# Patient Record
Sex: Female | Born: 2000 | Race: Black or African American | Hispanic: No | Marital: Single
Health system: Southern US, Academic
[De-identification: ages and names within clinical notes are randomized; demographics above are authoritative.]

## PROBLEM LIST (undated history)

## (undated) ENCOUNTER — Encounter

## (undated) ENCOUNTER — Encounter
Attending: Student in an Organized Health Care Education/Training Program | Primary: Student in an Organized Health Care Education/Training Program

## (undated) ENCOUNTER — Encounter: Attending: Pediatrics | Primary: Pediatrics

## (undated) ENCOUNTER — Ambulatory Visit: Payer: PRIVATE HEALTH INSURANCE

## (undated) ENCOUNTER — Telehealth

## (undated) ENCOUNTER — Ambulatory Visit

## (undated) ENCOUNTER — Ambulatory Visit
Payer: Medicaid (Managed Care) | Attending: Student in an Organized Health Care Education/Training Program | Primary: Student in an Organized Health Care Education/Training Program

## (undated) ENCOUNTER — Ambulatory Visit
Payer: PRIVATE HEALTH INSURANCE | Attending: Student in an Organized Health Care Education/Training Program | Primary: Student in an Organized Health Care Education/Training Program

## (undated) ENCOUNTER — Non-Acute Institutional Stay: Payer: PRIVATE HEALTH INSURANCE

## (undated) ENCOUNTER — Telehealth: Attending: Pediatrics | Primary: Pediatrics

## (undated) ENCOUNTER — Ambulatory Visit: Payer: Medicaid (Managed Care)

## (undated) ENCOUNTER — Encounter: Attending: Physician Assistant | Primary: Physician Assistant

## (undated) ENCOUNTER — Ambulatory Visit: Attending: Specialist | Primary: Specialist

## (undated) ENCOUNTER — Encounter: Payer: PRIVATE HEALTH INSURANCE | Attending: Pediatrics | Primary: Pediatrics

## (undated) DIAGNOSIS — D899 Disorder involving the immune mechanism, unspecified: Secondary | ICD-10-CM

## (undated) DIAGNOSIS — M0890 Juvenile arthritis, unspecified, unspecified site: Secondary | ICD-10-CM

## (undated) DIAGNOSIS — D849 Immunodeficiency, unspecified: Secondary | ICD-10-CM

## (undated) DIAGNOSIS — M0880 Other juvenile arthritis, unspecified site: Secondary | ICD-10-CM

## (undated) HISTORY — PX: JOINT REPLACEMENT: SHX530

---
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
## 2021-01-01 ENCOUNTER — Other Ambulatory Visit: Payer: Self-pay | Admitting: Pharmacist

## 2021-01-01 ENCOUNTER — Telehealth: Payer: Self-pay | Admitting: Pharmacist

## 2021-01-01 DIAGNOSIS — Z79899 Other long term (current) drug therapy: Secondary | ICD-10-CM

## 2021-01-01 DIAGNOSIS — M0880 Other juvenile arthritis, unspecified site: Secondary | ICD-10-CM

## 2021-01-01 NOTE — Telephone Encounter (Signed)
-----   Message from Fuller Plan, MD sent at 01/01/2021  7:58 AM EDT ----- Regarding: Remicade dosing Her disease appears to be very well controlled, less flares or swelling since switching to rinvoq. I would like to start decreasing her remicade dose frequency first from q4eeks to [place]. I plan to follow up at 3 month intervals if remaining well controlled will reduce this to q8weeks next. Currently has next one scheduled 01/16/21 if that can be moved back 2 weeks if available.

## 2021-01-01 NOTE — Progress Notes (Signed)
Sedimentation rate is improved more down to 53. TB test remains negative. Her vitamin D level is quite low at 16 it should be over 30. This can worsen her skin inflammation also bad for bone health. I recommend she needs to add daily supplementation 2000 units of vitamin D3.

## 2021-01-01 NOTE — Progress Notes (Signed)
Next infusion scheduled for Remicade on 01/16/21 and due for updated orders. Dr. Dimple Casey would like to change patient's Remicade dosing interval from every 4 weeks to every 6 weeks, therefore her next Remicade infusion would have to be r/s to 01/30/21  Diagnosis:polyarticular juvenile idiopathic arthritis  Dose: 1000mg  every 6 weeks  Last Clinic Visit: 12/27/20 Next Clinic Visit: not yet scheduled  Last infusion: 12/19/20  Labs: CBC and CMP on 11/21/20 - wnl TB Gold: negative on 12/27/20  Orders placed for Remicade x 2 doses along with premedication of acetaminophen and diphenhydramine to be administered 30 minutes before medication infusion.  Standing CBC with diff/platelet and CMP with GFR orders placed to be drawn every 2 months.  Next TB gold due 12/27/21  Called patient to review and provide phone number for: [facility] [phone]) [facility] but unable to reach and VM box not set up. Sent MyChart message to patient  Will follow-up to ensured Remicade infusion is re-scheduled and completed  [facility], PharmD, MPH, BCPS Clinical Pharmacist (Rheumatology and Pulmonology)

## 2021-01-03 NOTE — Progress Notes (Signed)
Patient r/s next Remicade infusion to 01/30/21 (which is 6 weeks after last infusion on 12/19/20).  Routing to Dr. Dimple Casey as Lorain Childes.

## 2021-01-16 ENCOUNTER — Ambulatory Visit (HOSPITAL_COMMUNITY): Payer: Medicaid Other

## 2021-01-26 ENCOUNTER — Other Ambulatory Visit: Payer: Self-pay | Admitting: Internal Medicine

## 2021-01-26 DIAGNOSIS — M0880 Other juvenile arthritis, unspecified site: Secondary | ICD-10-CM

## 2021-01-26 NOTE — Telephone Encounter (Signed)
Next Visit: 03/28/2021  Last Visit: 12/27/2020  Last Fill: 10/27/2020  YN:[id number] juvenile idiopathic arthritis   Current Dose per office note 12/27/2020: upadacitinib 15 mg pO daily  Labs: 11/21/2020 WNL  TB Gold: 12/27/2020 Neg    Okay to refill Rinvoq?

## 2021-01-30 ENCOUNTER — Encounter (HOSPITAL_COMMUNITY)
Admission: RE | Admit: 2021-01-30 | Discharge: 2021-01-30 | Disposition: A | Discharge status: Home / Self Care | Payer: Medicaid Other | Source: Ambulatory Visit | Attending: Rheumatology | Admitting: Rheumatology

## 2021-01-30 DIAGNOSIS — M0880 Other juvenile arthritis, unspecified site: Secondary | ICD-10-CM | POA: Diagnosis present

## 2021-01-30 DIAGNOSIS — Z79899 Other long term (current) drug therapy: Secondary | ICD-10-CM | POA: Diagnosis present

## 2021-01-30 LAB — COMPREHENSIVE METABOLIC PANEL
ALT: 19 U/L (ref 0–44)
AST: 21 U/L (ref 15–41)
Albumin: 3.7 g/dL (ref 3.5–5.0)
Alkaline Phosphatase: 47 U/L (ref 38–126)
Anion gap: 7 (ref 5–15)
BUN: 7 mg/dL (ref 6–20)
CO2: 21 mmol/L — ABNORMAL LOW (ref 22–32)
Calcium: 9.1 mg/dL (ref 8.9–10.3)
Chloride: 107 mmol/L (ref 98–111)
Creatinine, Ser: 0.72 mg/dL (ref 0.44–1.00)
GFR, Estimated: >60 mL/min (ref >60)
Glucose, Bld: 114 mg/dL — ABNORMAL HIGH (ref 70–99)
Potassium: 4 mmol/L (ref 3.5–5.1)
Sodium: 135 mmol/L (ref 135–145)
Total Bilirubin: 0.4 mg/dL (ref 0.3–1.2)
Total Protein: 7.2 g/dL (ref 6.5–8.1)

## 2021-01-30 LAB — CBC WITH DIFFERENTIAL/PLATELET
Abs Immature Granulocytes: 0.06 10*3/uL (ref 0.00–0.07)
Basophils Absolute: 0.1 10*3/uL (ref 0.0–0.1)
Basophils Relative: 1 %
Eosinophils Absolute: 0.1 10*3/uL (ref 0.0–0.5)
Eosinophils Relative: 1 %
HCT: 39.7 % (ref 36.0–46.0)
Hemoglobin: 13.3 g/dL (ref 12.0–15.0)
Immature Granulocytes: 1 %
Lymphocytes Relative: 51 %
Lymphs Abs: 4.4 10*3/uL — ABNORMAL HIGH (ref 0.7–4.0)
MCH: 30.3 pg (ref 26.0–34.0)
MCHC: 33.5 g/dL (ref 30.0–36.0)
MCV: 90.4 fL (ref 80.0–100.0)
Monocytes Absolute: 0.7 10*3/uL (ref 0.1–1.0)
Monocytes Relative: 8 %
Neutro Abs: 3.2 10*3/uL (ref 1.7–7.7)
Neutrophils Relative %: 38 %
Platelets: 341 10*3/uL (ref 150–400)
RBC: 4.39 MIL/uL (ref 3.87–5.11)
RDW: 13.4 % (ref 11.5–15.5)
WBC: 8.4 10*3/uL (ref 4.0–10.5)
nRBC: 0 % (ref 0.0–0.2)

## 2021-01-30 MED ORDER — DIPHENHYDRAMINE HCL 25 MG PO CAPS
25.0000 mg | ORAL_CAPSULE | ORAL | Status: DC
  Administered 2021-01-30: 25 mg via ORAL

## 2021-01-30 MED ORDER — SODIUM CHLORIDE 0.9 % IV SOLN
1000.0000 mg | INTRAVENOUS | Status: DC
  Administered 2021-01-30: 1000 mg via INTRAVENOUS
  Filled 2021-01-30 (×2): qty 100

## 2021-01-30 MED ORDER — METHYLPREDNISOLONE SODIUM SUCC 40 MG IJ SOLR: 40.0000 mg | INTRAMUSCULAR | Status: DC

## 2021-01-30 MED ORDER — ACETAMINOPHEN 325 MG PO TABS: 650.0000 mg | ORAL_TABLET | ORAL | Status: DC

## 2021-01-30 MED ORDER — DIPHENHYDRAMINE HCL 25 MG PO CAPS
ORAL_CAPSULE | ORAL | Status: AC
  Filled 2021-01-30: qty 2

## 2021-01-30 MED ORDER — METHYLPREDNISOLONE SODIUM SUCC 40 MG IJ SOLR
INTRAMUSCULAR | Status: AC
  Administered 2021-01-30: 40 mg via INTRAVENOUS
  Filled 2021-01-30: qty 1

## 2021-01-30 MED ORDER — ACETAMINOPHEN 325 MG PO TABS
ORAL_TABLET | ORAL | Status: AC
  Administered 2021-01-30: 650 mg via ORAL
  Filled 2021-01-30: qty 2

## 2021-01-31 NOTE — Progress Notes (Signed)
Lab tests show normal blood count and liver function tests, no medication change needed.

## 2021-03-13 ENCOUNTER — Ambulatory Visit (HOSPITAL_COMMUNITY)
Admission: RE | Admit: 2021-03-13 | Discharge: 2021-03-13 | Disposition: A | Discharge status: Home / Self Care | Payer: Medicaid Other | Source: Ambulatory Visit | Attending: Internal Medicine | Admitting: Internal Medicine

## 2021-03-13 ENCOUNTER — Other Ambulatory Visit: Payer: Self-pay

## 2021-03-13 DIAGNOSIS — M0880 Other juvenile arthritis, unspecified site: Secondary | ICD-10-CM | POA: Diagnosis not present

## 2021-03-13 MED ORDER — METHYLPREDNISOLONE SODIUM SUCC 40 MG IJ SOLR
INTRAMUSCULAR | Status: AC
  Filled 2021-03-13: qty 1

## 2021-03-13 MED ORDER — DIPHENHYDRAMINE HCL 25 MG PO CAPS
25.0000 mg | ORAL_CAPSULE | ORAL | Status: DC
  Administered 2021-03-13: 25 mg via ORAL

## 2021-03-13 MED ORDER — SODIUM CHLORIDE 0.9 % IV SOLN
1000.0000 mg | INTRAVENOUS | Status: DC
  Administered 2021-03-13: 1000 mg via INTRAVENOUS
  Filled 2021-03-13: qty 100

## 2021-03-13 MED ORDER — ACETAMINOPHEN 325 MG PO TABS
ORAL_TABLET | ORAL | Status: AC
  Filled 2021-03-13: qty 2

## 2021-03-13 MED ORDER — METHYLPREDNISOLONE SODIUM SUCC 40 MG IJ SOLR
40.0000 mg | INTRAMUSCULAR | Status: DC
  Administered 2021-03-13: 40 mg via INTRAVENOUS

## 2021-03-13 MED ORDER — DIPHENHYDRAMINE HCL 25 MG PO CAPS
ORAL_CAPSULE | ORAL | Status: AC
  Filled 2021-03-13: qty 1

## 2021-03-13 MED ORDER — ACETAMINOPHEN 325 MG PO TABS
650.0000 mg | ORAL_TABLET | ORAL | Status: DC
  Administered 2021-03-13: 650 mg via ORAL

## 2021-03-19 NOTE — Progress Notes (Signed)
Office Visit Note  Patient: Angie Stone             Date of Birth: October 24, 2000           MRN: [id number]             PCP: Lucila Maine Referring: Teena Irani, PA-C Visit Date: 03/20/2021   Subjective:  Follow-up (Doing good)   History of Present Illness: Angie Stone is a 20 y.o. 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  Symptoms remain well controlled no joint pain swelling or skin disease activity.  Previous HPI 12/27/20 Angie Stone is a 20 y.o. 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 Since our last visit she had no disease activity flare ups. She is tolerating infusions uneventfully. She had increase in facial acne symptoms. Hidradenitis symptoms are not increased. She is not taking any vitamin D supplement.    Previous HPI 03/06/20 Angie Stone is a 20 y.o. 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 She is looking to transfer care to [facility] from [facility] and [place] due to proximity and transitioning to adult rheumatology clinic. Currently she feels symptoms are very well controlled but she restarting the infliximab infusions round March due to symptoms increased on tofacitinib monotherapy.    She denies eye inflammation or visual acuity change. She does have recurrent skin lesions she describes as boils over the same area on her upper torso or axillary region. She denies cough, dyspnea, lymphadenopathy, oral ulcers, diarrhea, hematochezia, or alopecia.   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 She has no history of uveitis. She underwent bilateral hip arthroplasty in 2015.   Previous  treatments: Tocilizumab, methotrexate ~ 2014 Orencia, Kineret, methotrexate 2014-2015, 2016 Remicade, methotrexate [place] 2016-2020 Sulfasalazine 2018-2019 not tolerated due to rash Leflunomide 2019 ineffective Tofacitinib 2019-current Infliximab resumed 05/2019 due to disease flare   Review of Systems  Constitutional:  Negative for fatigue.  HENT:  Negative for mouth dryness.   Eyes:  Negative for dryness.  Respiratory:  Negative for shortness of breath.   Cardiovascular:  Negative for swelling in legs/feet.  Gastrointestinal:  Negative for diarrhea.  Endocrine: Negative for excessive thirst.  Genitourinary:  Negative for difficulty urinating.  Musculoskeletal:  Negative for morning stiffness.  Skin:  Negative for rash.  Allergic/Immunologic: Negative for susceptible to infections.  Neurological:  Negative for weakness.  Hematological:  Negative for bruising/bleeding tendency.  Psychiatric/Behavioral:  Negative for sleep disturbance.    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    Past Medical History:  Diagnosis Date   Immunosuppression (HCC)    [name] (juvenile idiopathic arthritis) (HCC)     Family History  Problem Relation Age of Onset   Diabetes Mother    Diabetes Maternal Grandmother    Arthritis Paternal Grandmother    Past Surgical History:  Procedure Laterality Date   JOINT REPLACEMENT  Bilateral    Hips   Social History   Social History Narrative   Not on file   Immunization History  Administered Date(s) Administered   PFIZER(Purple Top)SARS-COV-2 Vaccination 02/18/2020, 03/22/2020     Objective: Vital Signs: BP 115/71 (BP  Location: Left Arm, Patient Position: Sitting, Cuff Size: Large)    Pulse 89    Resp 15    Ht 4\' 11"  (1.499 m)    Wt 245 lb (111.1 kg)    BMI 49.48 kg/m    Physical Exam Constitutional:      Appearance: She is obese.  HENT:     Right Ear: External ear normal.     Left Ear: External ear normal.     Mouth/Throat:     Mouth: Mucous membranes are moist.     Pharynx: Oropharynx is clear.  Eyes:     Conjunctiva/sclera: Conjunctivae normal.  Cardiovascular:     Rate and Rhythm: Normal rate and regular rhythm.  Pulmonary:     Effort: Pulmonary effort is normal.     Breath sounds: Normal breath sounds.  Skin:    General: Skin is warm and dry.     Comments: Mild facial acne rash  Neurological:     Mental Status: She is alert.  Psychiatric:        Mood and Affect: Mood normal.     Musculoskeletal Exam:  Shoulders full ROM no tenderness or swelling Elbows full ROM no tenderness or swelling Wrists full ROM no tenderness or swelling Fingers full ROM no tenderness or swelling Knees full ROM no tenderness or swelling Ankles full ROM no tenderness or swelling MTPs full ROM no tenderness or swelling   CDAI Exam: CDAI Score: 1  Patient Global: 10 mm; Provider Global: 0 mm Swollen: 0 ; Tender: 0  Joint Exam 03/20/2021   All documented joints were normal     Investigation: No additional findings.  Imaging: No results found.  Recent Labs: Lab Results  Component Value Date   WBC 8.4 01/30/2021   HGB 13.3 01/30/2021   PLT 341 01/30/2021   NA 135 01/30/2021   K 4.0 01/30/2021   CL 107 01/30/2021   CO2 21 (L) 01/30/2021   GLUCOSE 114 (H) 01/30/2021   BUN 7 01/30/2021   CREATININE 0.72 01/30/2021   BILITOT 0.4 01/30/2021   ALKPHOS 47 01/30/2021   AST 21 01/30/2021   ALT 19 01/30/2021   PROT 7.2 01/30/2021   ALBUMIN 3.7 01/30/2021   CALCIUM 9.1 01/30/2021   GFRAA 144 09/13/2020   QFTBGOLDPLUS NEGATIVE 12/27/2020    Speciality Comments: Diagnosed in 2010. Combination  of tocilizumab IV q2wk and MTX-initiation unclear-d/c 09/2012 Combination Orencia IV q4wk, Kineret sq daily, MTX-7/14-4/15 Tried injectable MTX 30 mg weekly-d/c SE and inefficacy  Remicade infusions 10 mg/kg every 4 weeks Tried SSZ in 2018 and again 2019-d/c both times due to rash Arava-05/16/17-01/06/18-Inadequate response Xeljanz started 01/08/18  Procedures:  No procedures performed Allergies: Sulfasalazine   Assessment / Plan:     Visit Diagnoses: Polyarticular juvenile idiopathic arthritis ([place]) - Plan: Sedimentation rate  Symptoms appear well controlled on history and exam today.  Checking sed rate for disease activity monitoring.  Plan to continue Rinvoq 15 mg p.o. daily.  If abnormal will continue with decreasing infusion frequency next step to every 8 weeks.  High risk medication use - Plan: CBC with Differential/Platelet, COMPLETE METABOLIC PANEL WITH GFR  Checking CBC and CMP today for medication monitoring with Rinvoq and Remicade.  Continuing to slowly  titrate down medication to reduce risk of adverse events of excessive immunosuppression.  Vitamin D deficiency    Orders: Orders Placed This Encounter  Procedures   Sedimentation rate   CBC with Differential/Platelet   COMPLETE METABOLIC PANEL WITH GFR   No orders of the defined types were placed in this encounter.    Follow-Up Instructions: Return in about 3 months (around 06/18/2021) for RA on UPA/INF f/u 64mos.   Collier Salina, MD  Note - This record has been created using [employer].  Chart creation errors have been sought, but may not always  have been located. Such creation errors do not reflect on  the standard of medical care.

## 2021-03-20 ENCOUNTER — Ambulatory Visit (INDEPENDENT_AMBULATORY_CARE_PROVIDER_SITE_OTHER): Payer: Medicaid Other | Admitting: Internal Medicine

## 2021-03-20 ENCOUNTER — Other Ambulatory Visit: Payer: Self-pay

## 2021-03-20 ENCOUNTER — Encounter: Payer: Self-pay | Admitting: Internal Medicine

## 2021-03-20 VITALS — BP 115/71 | HR 89 | Resp 15 | Ht 59.0 in | Wt 245.0 lb

## 2021-03-20 DIAGNOSIS — E559 Vitamin D deficiency, unspecified: Secondary | ICD-10-CM | POA: Diagnosis not present

## 2021-03-20 DIAGNOSIS — Z79899 Other long term (current) drug therapy: Secondary | ICD-10-CM | POA: Diagnosis not present

## 2021-03-20 DIAGNOSIS — M0880 Other juvenile arthritis, unspecified site: Secondary | ICD-10-CM

## 2021-03-20 LAB — CBC WITH DIFFERENTIAL/PLATELET
Absolute Monocytes: 921 cells/uL (ref 200–950)
Basophils Absolute: 59 cells/uL (ref 0–200)
Basophils Relative: 0.6 %
Eosinophils Absolute: 69 cells/uL (ref 15–500)
Eosinophils Relative: 0.7 %
HCT: 42.4 % (ref 35.0–45.0)
Hemoglobin: 13.7 g/dL (ref 11.7–15.5)
Lymphs Abs: 3851 cells/uL (ref 850–3900)
MCH: 29 pg (ref 27.0–33.0)
MCHC: 32.3 g/dL (ref 32.0–36.0)
MCV: 89.8 fL (ref 80.0–100.0)
MPV: 9.2 fL (ref 7.5–12.5)
Monocytes Relative: 9.4 %
Neutro Abs: 4900 cells/uL (ref 1500–7800)
Neutrophils Relative %: 50 %
Platelets: 356 10*3/uL (ref 140–400)
RBC: 4.72 10*6/uL (ref 3.80–5.10)
RDW: 13.1 % (ref 11.0–15.0)
Total Lymphocyte: 39.3 %
WBC: 9.8 10*3/uL (ref 3.8–10.8)

## 2021-03-20 LAB — COMPLETE METABOLIC PANEL WITH GFR
AG Ratio: 1.4 (calc) (ref 1.0–2.5)
ALT: 19 U/L (ref 5–32)
AST: 15 U/L (ref 12–32)
Albumin: 4.3 g/dL (ref 3.6–5.1)
Alkaline phosphatase (APISO): 60 U/L (ref 36–128)
BUN: 10 mg/dL (ref 7–20)
CO2: 23 mmol/L (ref 20–32)
Calcium: 9.4 mg/dL (ref 8.9–10.4)
Chloride: 105 mmol/L (ref 98–110)
Creat: 0.73 mg/dL (ref 0.50–0.96)
Globulin: 3 g/dL (calc) (ref 2.0–3.8)
Glucose, Bld: 78 mg/dL (ref 65–99)
Potassium: 4.1 mmol/L (ref 3.8–5.1)
Sodium: 137 mmol/L (ref 135–146)
Total Bilirubin: 0.5 mg/dL (ref 0.2–1.1)
Total Protein: 7.3 g/dL (ref 6.3–8.2)
eGFR: 121 mL/min/{1.73_m2} (ref >=60)

## 2021-03-20 LAB — SEDIMENTATION RATE: Sed Rate: 6 mm/h (ref 0–20)

## 2021-03-21 NOTE — Progress Notes (Signed)
Angie Stone is doing well with increased interval between remicade doses. I think after her next one scheduled in January we can decrease frequency to every 8 weeks afterwards.

## 2021-03-21 NOTE — Progress Notes (Signed)
Lab results look good her blood count and metabolic panel are normal so no problems with the medication. Her sedimentation rate is now normal which is an improvement compared to the past year.

## 2021-03-23 ENCOUNTER — Other Ambulatory Visit: Payer: Self-pay | Admitting: Pharmacist

## 2021-03-23 DIAGNOSIS — M0880 Other juvenile arthritis, unspecified site: Secondary | ICD-10-CM

## 2021-03-23 DIAGNOSIS — Z79899 Other long term (current) drug therapy: Secondary | ICD-10-CM

## 2021-03-23 NOTE — Progress Notes (Signed)
Next infusion scheduled for Remicade on 04/24/21 and due for updated orders. Diagnosis: polyarticular juvenile arthritis  Dose: 1000mg  every 8 weeks (infusion on 04/24/21 will be last infusion for every 6 weeks interval. Subsequent infusion will be in 8 weeks on 06/19/21)  Last Clinic Visit: 03/20/21 Next Clinic Visit: not yet scheduled  Last infusion: 03/13/21  Labs: 03/20/21 wnl TB Gold: negative on 12/27/20   Orders placed for Remicade x 2 doses along with premedication of acetaminophen, methyprednisolone 40mg , and diphenhydramine to be administered 30 minutes before medication infusion.  Standing CBC with diff/platelet and CMP with GFR orders placed to be drawn every 2 months.  Next TB gold due 12/27/21  , PharmD, MPH, BCPS Clinical Pharmacist (Rheumatology and Pulmonology)

## 2021-03-28 ENCOUNTER — Ambulatory Visit: Payer: Medicaid Other | Admitting: Internal Medicine

## 2021-04-24 ENCOUNTER — Ambulatory Visit (HOSPITAL_COMMUNITY)
Admission: RE | Admit: 2021-04-24 | Discharge: 2021-04-24 | Disposition: A | Discharge status: Home / Self Care | Payer: Medicaid Other | Source: Ambulatory Visit | Attending: Internal Medicine | Admitting: Internal Medicine

## 2021-04-24 DIAGNOSIS — M0880 Other juvenile arthritis, unspecified site: Secondary | ICD-10-CM | POA: Insufficient documentation

## 2021-04-24 MED ORDER — SODIUM CHLORIDE 0.9 % IV SOLN
1000.0000 mg | INTRAVENOUS | Status: DC
  Administered 2021-04-24: 12:00:00 1000 mg via INTRAVENOUS
  Filled 2021-04-24: qty 100

## 2021-04-24 MED ORDER — ACETAMINOPHEN 325 MG PO TABS
650.0000 mg | ORAL_TABLET | ORAL | Status: DC
  Administered 2021-04-24: 11:00:00 650 mg via ORAL

## 2021-04-24 MED ORDER — METHYLPREDNISOLONE SODIUM SUCC 40 MG IJ SOLR
40.0000 mg | INTRAMUSCULAR | Status: DC
  Administered 2021-04-24: 11:00:00 40 mg via INTRAVENOUS

## 2021-04-24 MED ORDER — ACETAMINOPHEN 325 MG PO TABS
ORAL_TABLET | ORAL | Status: AC
  Filled 2021-04-24: qty 2

## 2021-04-24 MED ORDER — DIPHENHYDRAMINE HCL 25 MG PO CAPS
25.0000 mg | ORAL_CAPSULE | ORAL | Status: DC
  Administered 2021-04-24: 11:00:00 25 mg via ORAL

## 2021-04-24 MED ORDER — METHYLPREDNISOLONE SODIUM SUCC 40 MG IJ SOLR
INTRAMUSCULAR | Status: AC
  Filled 2021-04-24: qty 1

## 2021-04-24 MED ORDER — DIPHENHYDRAMINE HCL 25 MG PO CAPS
ORAL_CAPSULE | ORAL | Status: AC
  Filled 2021-04-24: qty 1

## 2021-04-26 ENCOUNTER — Other Ambulatory Visit: Payer: Self-pay | Admitting: Internal Medicine

## 2021-04-26 DIAGNOSIS — M0880 Other juvenile arthritis, unspecified site: Secondary | ICD-10-CM

## 2021-04-26 NOTE — Telephone Encounter (Signed)
Next Visit: Due March 2023. Message sent to the front to schedule patient   Last Visit: 03/20/2021  Last Fill: 01/26/2021  WU:[id number]:Polyarticular juvenile idiopathic arthritis   Current Dose per office note 03/20/2021: Rinvoq 15 mg p.o. daily.  Labs: 03/20/2021 Lab results look good her blood count and metabolic panel are normal so no problems with the medication  TB Gold: 12/27/2020 Neg    Okay to refill Rinvoq?

## 2021-05-10 ENCOUNTER — Telehealth: Payer: Self-pay

## 2021-05-10 NOTE — Telephone Encounter (Signed)
Pt has coverage through [facility] Medicaid. Contacted plan and verified that no PA is required for [id number] when performed in an outpatient hospital during for 2023.  Ref# [id number]

## 2021-05-23 ENCOUNTER — Telehealth: Payer: Self-pay

## 2021-05-23 NOTE — Telephone Encounter (Signed)
Patient called stating she would like to change her infusions to every 6 weeks instead of every 8 weeks.  Patient requested a return call.

## 2021-05-28 ENCOUNTER — Encounter: Payer: Self-pay | Admitting: Internal Medicine

## 2021-05-28 NOTE — Telephone Encounter (Signed)
Angie Stone reports increased joint pains since about 2 weeks ago in bilateral knees and feet. Last infusion was about 1 month ago in January. I am not sure if this is really related to disease activity difference with dosing change. But she is concerned for this and I think it is reasonable to slow down our tapering and keep this q6 weeks for now. Next infusion was scheduled for 3/21 so would be potentially moving that forward 2 weeks.

## 2021-05-31 ENCOUNTER — Other Ambulatory Visit: Payer: Self-pay | Admitting: Pharmacist

## 2021-05-31 DIAGNOSIS — Z79899 Other long term (current) drug therapy: Secondary | ICD-10-CM

## 2021-05-31 DIAGNOSIS — M0880 Other juvenile arthritis, unspecified site: Secondary | ICD-10-CM

## 2021-05-31 NOTE — Progress Notes (Signed)
Next infusion scheduled for Remicade on 06/19/21 and due for updated orders. ?Diagnosis: polyarticular juvenile arthritis ? ?Dose: 1000mg  every 6 weeks (patient is going back to every 6 weeks infusions due to increased joint pain- last infusion was 04/24/21 and can have next infusion scheduled for 06/05/21 earliest) ? ?Last Clinic Visit: 03/20/21 ?Next Clinic Visit: not yet scheduled ? ?Last infusion: 04/24/21 ? ?Labs: 03/20/21 wnl ?TB Gold: negative on 12/27/20  ? ?Orders placed for Remicade x 2 doses along with premedication of acetaminophen, methyprednisolone 40mg , and diphenhydramine to be administered 30 minutes before medication infusion. ? ?Standing CBC with diff/platelet and CMP with GFR orders placed to be drawn every 2 months.  Next TB gold due 12/27/21 ? ?I called patient to notify and advised her to call [facility] to have infusion r/s to be on or after 06/05/21 ? ?Knox Saliva, PharmD, MPH, BCPS ?Clinical Pharmacist (Rheumatology and Pulmonology) ? ?

## 2021-05-31 NOTE — Telephone Encounter (Signed)
Remicade infusion order renewed for every 6 week interval. I called patient to notify that she should reach out to [facility] to r/s next infusion to be on or after 06/05/21. She verbalized understanding. She states she has phone number to have infusion r/s ? ?Knox Saliva, PharmD, MPH, BCPS ?Clinical Pharmacist (Rheumatology and Pulmonology) ?

## 2021-05-31 NOTE — Telephone Encounter (Signed)
Patient r/s Remicade to 06/11/21. ? ?Chesley Mires, PharmD, MPH, BCPS ?Clinical Pharmacist (Rheumatology and Pulmonology) ?

## 2021-06-05 ENCOUNTER — Ambulatory Visit (HOSPITAL_COMMUNITY): Payer: Medicaid Other

## 2021-06-11 ENCOUNTER — Ambulatory Visit (HOSPITAL_COMMUNITY)
Admission: RE | Admit: 2021-06-11 | Discharge: 2021-06-11 | Disposition: A | Discharge status: Home / Self Care | Payer: Medicaid Other | Source: Ambulatory Visit | Attending: Internal Medicine | Admitting: Internal Medicine

## 2021-06-11 ENCOUNTER — Other Ambulatory Visit: Payer: Self-pay

## 2021-06-11 DIAGNOSIS — M0880 Other juvenile arthritis, unspecified site: Secondary | ICD-10-CM | POA: Diagnosis present

## 2021-06-11 DIAGNOSIS — Z79899 Other long term (current) drug therapy: Secondary | ICD-10-CM | POA: Insufficient documentation

## 2021-06-11 LAB — COMPREHENSIVE METABOLIC PANEL
ALT: 18 U/L (ref 0–44)
AST: 40 U/L (ref 15–41)
Albumin: 3.4 g/dL — ABNORMAL LOW (ref 3.5–5.0)
Alkaline Phosphatase: 41 U/L (ref 38–126)
Anion gap: 8 (ref 5–15)
BUN: 6 mg/dL (ref 6–20)
CO2: 21 mmol/L — ABNORMAL LOW (ref 22–32)
Calcium: 8.6 mg/dL — ABNORMAL LOW (ref 8.9–10.3)
Chloride: 110 mmol/L (ref 98–111)
Creatinine, Ser: 0.84 mg/dL (ref 0.44–1.00)
GFR, Estimated: >60 mL/min (ref >60)
Glucose, Bld: 97 mg/dL (ref 70–99)
Potassium: 5.2 mmol/L — ABNORMAL HIGH (ref 3.5–5.1)
Sodium: 139 mmol/L (ref 135–145)
Total Bilirubin: 1.7 mg/dL — ABNORMAL HIGH (ref 0.3–1.2)
Total Protein: 6.4 g/dL — ABNORMAL LOW (ref 6.5–8.1)

## 2021-06-11 LAB — CBC WITH DIFFERENTIAL/PLATELET
Abs Immature Granulocytes: 0.07 10*3/uL (ref 0.00–0.07)
Basophils Absolute: 0.1 10*3/uL (ref 0.0–0.1)
Basophils Relative: 1 %
Eosinophils Absolute: 0.1 10*3/uL (ref 0.0–0.5)
Eosinophils Relative: 1 %
HCT: 38.6 % (ref 36.0–46.0)
Hemoglobin: 13 g/dL (ref 12.0–15.0)
Immature Granulocytes: 1 %
Lymphocytes Relative: 52 %
Lymphs Abs: 4.4 10*3/uL — ABNORMAL HIGH (ref 0.7–4.0)
MCH: 30.2 pg (ref 26.0–34.0)
MCHC: 33.7 g/dL (ref 30.0–36.0)
MCV: 89.6 fL (ref 80.0–100.0)
Monocytes Absolute: 0.8 10*3/uL (ref 0.1–1.0)
Monocytes Relative: 10 %
Neutro Abs: 2.9 10*3/uL (ref 1.7–7.7)
Neutrophils Relative %: 35 %
Platelets: 331 10*3/uL (ref 150–400)
RBC: 4.31 MIL/uL (ref 3.87–5.11)
RDW: 14.2 % (ref 11.5–15.5)
WBC: 8.3 10*3/uL (ref 4.0–10.5)
nRBC: 0 % (ref 0.0–0.2)

## 2021-06-11 MED ORDER — DIPHENHYDRAMINE HCL 25 MG PO CAPS
25.0000 mg | ORAL_CAPSULE | ORAL | Status: AC
  Administered 2021-06-11: 25 mg via ORAL

## 2021-06-11 MED ORDER — DIPHENHYDRAMINE HCL 25 MG PO CAPS
ORAL_CAPSULE | ORAL | Status: AC
  Filled 2021-06-11: qty 1

## 2021-06-11 MED ORDER — SODIUM CHLORIDE 0.9 % IV SOLN
1000.0000 mg | INTRAVENOUS | Status: AC
  Administered 2021-06-11: 1000 mg via INTRAVENOUS
  Filled 2021-06-11: qty 100

## 2021-06-11 MED ORDER — ACETAMINOPHEN 325 MG PO TABS
650.0000 mg | ORAL_TABLET | ORAL | Status: AC
  Administered 2021-06-11: 650 mg via ORAL

## 2021-06-11 MED ORDER — METHYLPREDNISOLONE SODIUM SUCC 40 MG IJ SOLR
40.0000 mg | INTRAMUSCULAR | Status: DC
  Administered 2021-06-11: 40 mg via INTRAVENOUS

## 2021-06-11 MED ORDER — ACETAMINOPHEN 325 MG PO TABS
ORAL_TABLET | ORAL | Status: AC
Start: 2021-06-11 — End: 2021-06-11
  Filled 2021-06-11: qty 2

## 2021-06-11 MED ORDER — METHYLPREDNISOLONE SODIUM SUCC 40 MG IJ SOLR
INTRAMUSCULAR | Status: AC
  Filled 2021-06-11: qty 1

## 2021-06-12 NOTE — Progress Notes (Signed)
Labs look okay for continuing remicade and rinvoq. She has some mild electrolyte abnormalities but these are not effects of these medications. Could she have been dehydrated or recently ill when labs were taken?

## 2021-06-19 ENCOUNTER — Ambulatory Visit (HOSPITAL_COMMUNITY): Payer: Medicaid Other

## 2021-07-23 ENCOUNTER — Other Ambulatory Visit: Payer: Self-pay | Admitting: *Deleted

## 2021-07-23 ENCOUNTER — Ambulatory Visit (HOSPITAL_COMMUNITY)
Admission: RE | Admit: 2021-07-23 | Discharge: 2021-07-23 | Disposition: A | Discharge status: Home / Self Care | Payer: Medicaid Other | Source: Ambulatory Visit | Attending: Internal Medicine | Admitting: Internal Medicine

## 2021-07-23 ENCOUNTER — Encounter: Payer: Self-pay | Admitting: Internal Medicine

## 2021-07-23 ENCOUNTER — Other Ambulatory Visit: Payer: Self-pay | Admitting: Pharmacist

## 2021-07-23 DIAGNOSIS — M0880 Other juvenile arthritis, unspecified site: Secondary | ICD-10-CM

## 2021-07-23 DIAGNOSIS — Z79899 Other long term (current) drug therapy: Secondary | ICD-10-CM | POA: Diagnosis present

## 2021-07-23 MED ORDER — ACETAMINOPHEN 325 MG PO TABS
650.0000 mg | ORAL_TABLET | ORAL | Status: DC
  Administered 2021-07-23: 650 mg via ORAL

## 2021-07-23 MED ORDER — METHYLPREDNISOLONE SODIUM SUCC 40 MG IJ SOLR
40.0000 mg | INTRAMUSCULAR | Status: DC
  Administered 2021-07-23: 40 mg via INTRAVENOUS

## 2021-07-23 MED ORDER — ACETAMINOPHEN 325 MG PO TABS
ORAL_TABLET | ORAL | Status: AC
  Filled 2021-07-23: qty 2

## 2021-07-23 MED ORDER — DIPHENHYDRAMINE HCL 25 MG PO CAPS
ORAL_CAPSULE | ORAL | Status: AC
  Filled 2021-07-23: qty 1

## 2021-07-23 MED ORDER — DIPHENHYDRAMINE HCL 25 MG PO CAPS
25.0000 mg | ORAL_CAPSULE | ORAL | Status: DC
  Administered 2021-07-23: 25 mg via ORAL

## 2021-07-23 MED ORDER — SODIUM CHLORIDE 0.9 % IV SOLN
1000.0000 mg | INTRAVENOUS | Status: DC
  Administered 2021-07-23: 1000 mg via INTRAVENOUS
  Filled 2021-07-23: qty 100

## 2021-07-23 MED ORDER — METHYLPREDNISOLONE SODIUM SUCC 40 MG IJ SOLR
INTRAMUSCULAR | Status: AC
  Filled 2021-07-23: qty 1

## 2021-07-23 NOTE — Telephone Encounter (Signed)
Referral for dermatology placed, appt scheduled 07/31/2021, I called patient. ?

## 2021-07-23 NOTE — Telephone Encounter (Signed)
I have not seen her face to face since December so would also benefit to schedule an appointment soon and see if we need to adjust her medication or prescribe something. ?I think we should go ahead with referring her to a dermatologist. This is concerning that it may be increased activity of her hidradenitis.

## 2021-07-23 NOTE — Progress Notes (Signed)
Next infusion scheduled for Remicade on 09/03/21 and due for updated orders. She takes in combination with Rinvoq 15mg  daily ? ?Diagnosis: polyarticular juvenile idiopathic arthritis ? ?Dose: 1000 mg IV every 6 weeks ? ?Last Clinic Visit: 07/31/21 ?Next Clinic Visit: 12/11/21 ? ?Last infusion: 07/23/21 ? ?Labs: CBC and CMP on 06/11/21  - mild electrolyte abnormalities, CBC stable ?TB Gold: negative on 12/27/20  ? ?Orders placed for Remicade x 2 doses along with premedication of acetaminophen, methylprednisolone 40mg , and diphenhydramine to be administered 30 minutes before medication infusion. ? ?Standing CBC with diff/platelet and CMP with GFR orders placed to be drawn every 2 months.  Next TB gold due 12/27/21 ? ?Chesley Mires, PharmD, MPH, BCPS ?Clinical Pharmacist (Rheumatology and Pulmonology) ?

## 2021-07-24 ENCOUNTER — Other Ambulatory Visit: Payer: Self-pay | Admitting: Internal Medicine

## 2021-07-24 DIAGNOSIS — M0880 Other juvenile arthritis, unspecified site: Secondary | ICD-10-CM

## 2021-07-30 ENCOUNTER — Ambulatory Visit (INDEPENDENT_AMBULATORY_CARE_PROVIDER_SITE_OTHER): Payer: Medicaid Other | Admitting: Dermatology

## 2021-07-30 ENCOUNTER — Encounter: Payer: Self-pay | Admitting: Dermatology

## 2021-07-30 DIAGNOSIS — L732 Hidradenitis suppurativa: Secondary | ICD-10-CM

## 2021-07-30 MED ORDER — DOXYCYCLINE MONOHYDRATE 50 MG PO CAPS: 50.0000 mg | ORAL_CAPSULE | Freq: Every day | ORAL | 2 refills | Status: DC

## 2021-07-30 NOTE — Patient Instructions (Addendum)
Hidradenitis Suppurativa is a chronic; persistent; non-curable, but treatable condition due to abnormal inflamed sweat glands in the body folds (axilla, inframammary, groin, medial thighs), causing recurrent painful draining cysts and scarring. It can be associated with severe scarring acne and cysts; also abscesses and scarring of scalp. The goal is control and prevention of flares, as it is not curable. Scars are permanent and can be thickened. Treatment may include daily use of topical medication and oral antibiotics.  Oral isotretinoin may also be helpful.  For more severe cases, Humira (a biologic injection) may be prescribed to decrease the inflammatory process and prevent flares.  When indicated, inflamed cysts may also be treated surgically. ? ? ?Doxycycline should be taken with food to prevent nausea. Do not lay down for 30 minutes after taking. Be cautious with sun exposure and use good sun protection while on this medication. Pregnant women should not take this medication.  ? ?Start CLN wash - can be purchased over the counter  can use daily ( use as a body wash, lather on to affected areas, let sit for a few minutes, then rinse.  ? ? ? ? ? ? ?If You Need Anything After Your Visit ? ?If you have any questions or concerns for your doctor, please call our main line at [phone] and press option 4 to reach your doctor's medical assistant. If no one answers, please leave a voicemail as directed and we will return your call as soon as possible. Messages left after 4 pm will be answered the following business day.  ? ?You may also send [place] a message via MyChart. We typically respond to MyChart messages within 1-2 business days. ? ?For prescription refills, please ask your pharmacy to contact our office. Our fax number is [phone]. ? ?If you have an urgent issue when the clinic is closed that cannot wait until the next business day, you can page your doctor at the number below.   ? ?Please note that while  we do our best to be available for urgent issues outside of office hours, we are not available 24/7.  ? ?If you have an urgent issue and are unable to reach [place], you may choose to seek medical care at your doctor's office, retail clinic, urgent care center, or emergency room. ? ?If you have a medical emergency, please immediately call 911 or go to the emergency department. ? ?Pager Numbers ? ?- Dr. Gwen Pounds: [phone] ? ?- Dr. Neale Burly: [phone] ? ?- Dr. Roseanne Reno: [phone] ? ?In the event of inclement weather, please call our main line at [phone] for an update on the status of any delays or closures. ? ?Dermatology Medication Tips: ?Please keep the boxes that topical medications come in in order to help keep track of the instructions about where and how to use these. Pharmacies typically print the medication instructions only on the boxes and not directly on the medication tubes.  ? ?If your medication is too expensive, please contact our office at [phone] option 4 or send [place] a message through MyChart.  ? ?We are unable to tell what your co-pay for medications will be in advance as this is different depending on your insurance coverage. However, we may be able to find a substitute medication at lower cost or fill out paperwork to get insurance to cover a needed medication.  ? ?If a prior authorization is required to get your medication covered by your insurance company, please allow [place] 1-2 business days to complete this process. ? ?  Drug prices often vary depending on where the prescription is filled and some pharmacies may offer cheaper prices. ? ?The website www.goodrx.com contains coupons for medications through different pharmacies. The prices here do not account for what the cost may be with help from insurance (it may be cheaper with your insurance), but the website can give you the price if you did not use any insurance.  ?- You can print the associated coupon and take it with your prescription  to the pharmacy.  ?- You may also stop by our office during regular business hours and pick up a GoodRx coupon card.  ?- If you need your prescription sent electronically to a different pharmacy, notify our office through [facility] or by phone at [phone] option 4. ? ? ? ? ?Si Usted Necesita Algo Despu?s de Su Visita ? ?Tambi?n puede enviarnos un mensaje a trav?s de MyChart. Por lo general respondemos a los mensajes de MyChart en el transcurso de 1 a 2 d?as h?biles. ? ?Para renovar recetas, por favor pida a su farmacia que se ponga en contacto con nuestra oficina. Nuestro n?mero de fax es el [phone]. ? ?Si tiene un asunto urgente cuando la cl?nica est? cerrada y que no puede esperar hasta el siguiente d?a h?bil, puede llamar/localizar a su doctor(a) al n?mero que aparece a continuaci?n.  ? ?Por favor, tenga en cuenta que aunque hacemos todo lo posible para estar disponibles para asuntos urgentes fuera del horario de oficina, no estamos disponibles las 24 horas del d?a, los 7 d?as de la semana.  ? ?Si tiene un problema urgente y no puede comunicarse con nosotros, puede optar por buscar atenci?n m?dica  en el consultorio de su doctor(a), en una cl?nica privada, en un centro de atenci?n urgente o en una sala de emergencias. ? ?Si tiene Radio broadcast assistant m?dica, por favor llame inmediatamente al 911 o vaya a la sala de emergencias. ? ?N?meros de b?per ? ?- Dr. Gwen Pounds: [phone] ? ?- Dra. Moye: [phone] ? ?- Dra. Roseanne Reno: [phone] ? ?En caso de inclemencias del tiempo, por favor llame a nuestra l?nea principal al [phone] para una actualizaci?n sobre el estado de cualquier retraso o cierre. ? ?Consejos para la medicaci?n en dermatolog?a: ?Por favor, guarde las cajas en las que vienen los medicamentos de uso t?pico para ayudarle a seguir las instrucciones sobre d?nde y c?mo usarlos. Las farmacias generalmente imprimen las instrucciones del medicamento s?lo en las cajas y no directamente en  los tubos del [place].  ? ?Si su medicamento es muy caro, por favor, p?ngase en contacto con Rolm Gala llamando al [phone] y presione la opci?n 4 o env?enos un mensaje a trav?s de MyChart.  ? ?No podemos decirle cu?l ser? su copago por los medicamentos por adelantado ya que esto es diferente dependiendo de la cobertura de su seguro. Sin embargo, es posible que podamos encontrar un medicamento sustituto a Audiological scientist un formulario para que el seguro cubra el medicamento que se considera necesario.  ? ?Si se requiere Neomia Dear autorizaci?n previa para que su compa??a de seguros [place] su medicamento, por favor perm?tanos de 1 a 2 d?as h?biles para completar este proceso. ? ?Los precios de los medicamentos var?an con frecuencia dependiendo del Environmental consultant de d?nde se surte la receta y alguna farmacias pueden ofrecer precios m?s baratos. ? ?El sitio web www.goodrx.com tiene cupones para medicamentos de Health and safety inspector. Los precios aqu? no tienen en cuenta lo que podr?a costar con la ayuda del seguro (puede ser m?s  barato con su seguro), pero el sitio web puede darle el precio si no utiliz? ning?n seguro.  ?- Puede imprimir el cup?n correspondiente y llevarlo con su receta a la farmacia.  ?- Tambi?n puede pasar por nuestra oficina durante el horario de atenci?n regular y recoger una tarjeta de cupones de GoodRx.  ?- Si necesita que su receta se env?e electr?nicamente a Psychiatrist, informe a nuestra oficina a trav?s de MyChart de [place] o por tel?fono llamando al [phone] y presione la opci?n 4. ? ?

## 2021-07-30 NOTE — Progress Notes (Signed)
? ?  New Patient Visit ? ?Subjective  ?Angie Stone is a 21 y.o. female who presents for the following: New Patient (Initial Visit) (Patient reports hx of hs of 2 or more years. Patient has active boil under right arm , hx of boils at b/l arms, under breast, and inner thighs. Patient has been rx of clindamycin and other antibiotic to treat. /). ? ?The following portions of the chart were reviewed this encounter and updated as appropriate:  ? Tobacco  Allergies  Meds  Problems  Med Hx  Surg Hx  Fam Hx   ?  ?Review of Systems:  No other skin or systemic complaints except as noted in HPI or Assessment and Plan. ? ?Objective  ?Well appearing patient in no apparent distress; mood and affect are within normal limits. ? ?A focused examination was performed including b/l axilla, inner thigh, b/l inframammary . Relevant physical exam findings are noted in the Assessment and Plan. ? 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 ? ? ? ? ? ? ? ? ? ? ?Assessment & Plan  ?Hidradenitis suppurativa ?b/l axilla, b/l inframammary ? ?Hidradenitis Suppurativa is a chronic; persistent; non-curable, but treatable condition due to abnormal inflamed sweat glands in the body folds (axilla, inframammary, groin, medial thighs), causing recurrent painful draining cysts and scarring. It can be associated with severe scarring acne and cysts; also abscesses and scarring of scalp. The goal is control and prevention of flares, as it is not curable. Scars are permanent and can be thickened. Treatment may include daily use of topical medication and oral antibiotics.  Oral isotretinoin may also be helpful.  For more severe cases, Humira (a biologic injection) may be prescribed to decrease the inflammatory process and prevent flares.  When indicated, inflamed cysts may also be treated surgically. ? ?HS Pamphlet Given ? ?Photos today  ? ?Start Doxycycline 50 mg capsule - take 1 po qd with evening meal and drink.   ?Start CLN wash. ? ?Doxycycline should be taken with food to prevent nausea. Do not lay down for 30 minutes after taking. Be cautious with sun exposure and use good sun protection while on this medication. Pregnant women should not take this medication.  ? ?Start CLN wash  (found over the counter / online) - apply to aa's of body lather let sit and few mins and rinse can use daily ? ?doxycycline (MONODOX) 50 MG capsule - b/l axilla, b/l inframammary ?Take 1 capsule (50 mg total) by mouth daily. Take with evening food and drink ? ?Return in about 3 months (around 10/30/2021) for HS follow up. ? ?I, Asher Muir, CMA, am acting as scribe for Armida Sans, MD. ?Documentation: I have reviewed the above documentation for accuracy and completeness, and I agree with the above. ? ?Armida Sans, MD ? ? ? ?

## 2021-07-30 NOTE — Progress Notes (Signed)
? ?Office Visit Note ? ?Patient: Angie Stone             ?Date of Birth: Jan 04, 2001           ?MRN: [id number]             ?PCP: Aura Dials, PA-C ?Referring: Aura Dials, PA-C ?Visit Date: 07/31/2021 ? ? ?Subjective:  ?Arthritis (Doing good) ? ? ?History of Present Illness: Angie Stone is a 21 y.o. female here for follow up for inflammatory arthritis with history of [name] on remicade infusion 1000 mg q6wks and rinvoq 15 mg daily. Joint symptoms are mostly doing well she had some days with increased knee pain and stiffness, no visible swelling, warmth, or redness. She notices joint pain is worse in the last 5th or 6th week between remicade infusion. Right axilla with few open sores draining other areas under arms and breasts about the same. She saw dermatology yesterday with recommendation of oral doxycycline and topical cleansing agent for hidradenitis symptoms. ? ?Previous HPI ?03/20/21 ?Angie Stone is a 21 y.o. 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  Symptoms remain well controlled no joint pain swelling or skin disease activity. ? ?Previous HPI ?03/06/20 ?Angie Stone is a 21 y.o. 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 She is looking to transfer care to [facility] from [place] due to proximity and transitioning to adult rheumatology clinic. Currently she feels symptoms are very well controlled but she restarting the infliximab infusions round March due to symptoms increased on tofacitinib monotherapy.  ?  ?She denies eye inflammation or visual acuity change. She does have recurrent skin lesions she describes as boils over the same area on her upper torso or axillary region. She denies cough, dyspnea, lymphadenopathy, oral ulcers, diarrhea, hematochezia, or alopecia. ?  ?She was originally diagnosed in 2010 with  involvement of bilateral hands, wrists, hips, knees, ankles, and TMJ. She has no history of uveitis. She underwent bilateral hip arthroplasty in 2015. ?  ?Previous treatments: ?Tocilizumab, methotrexate ~ 2014 ?Laurier Nancy, methotrexate [id number], 2016 ?Remicade, methotrexate [place] 2016-2020 ?Sulfasalazine 2018-2019 not tolerated due to rash ?Leflunomide 2019 ineffective ?Tofacitinib 2019-current ?Infliximab resumed 05/2019 due to disease flare ? ? ?Review of Systems  ?Constitutional:  Negative for fatigue.  ?HENT:  Negative for mouth dryness.   ?Eyes:  Negative for dryness.  ?Respiratory:  Negative for shortness of breath.   ?Cardiovascular:  Negative for swelling in legs/feet.  ?Gastrointestinal:  Negative for constipation.  ?Endocrine: Negative for excessive thirst.  ?Genitourinary:  Negative for difficulty urinating.  ?Musculoskeletal:  Negative for morning stiffness.  ?Skin:  Negative for rash.  ?Allergic/Immunologic: Negative for susceptible to infections.  ?Neurological:  Negative for weakness.  ?Hematological:  Negative for bruising/bleeding tendency.  ?Psychiatric/Behavioral:  Negative for sleep disturbance.   ? ?PMFS History:  ?Patient Active Problem List  ? Diagnosis Date Noted  ? Hidradenitis suppurativa 07/31/2021  ? 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 Pain in right knee 07/26/2020  ? History of bilateral hip replacements 04/05/2016  ? Vitamin D deficiency 11/10/2015  ? Nonintractable headache 04/26/2015  ? High risk medication use 01/16/2015  ? 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  ?Past Medical History:  ?Diagnosis Date  ? Immunosuppression ([place])   ? [name] (juvenile idiopathic arthritis) ([place])   ?  ?Family History  ?  Problem Relation Age of Onset  ? Diabetes Mother   ? Diabetes Maternal Grandmother   ? Arthritis Paternal Grandmother   ? ?Past Surgical History:  ?Procedure Laterality Date  ? JOINT REPLACEMENT Bilateral   ? Hips  ? ?Social History  ? ?Social  History Narrative  ? Not on file  ? ?Immunization History  ?Administered Date(s) Administered  ? PFIZER(Purple Top)SARS-COV-2 Vaccination 02/18/2020, 03/22/2020  ?  ? ?Objective: ?Vital Signs: BP 137/84 (BP Location: Right Arm, Patient Position: Sitting, Cuff Size: Normal)   Pulse 86   Resp 15   Ht 4\' 11"  (1.499 m)   Wt 250 lb (113.4 kg)   BMI 50.49 kg/m?   ? ?Physical Exam ?Constitutional:   ?   Appearance: She is obese.  ?HENT:  ?   Mouth/Throat:  ?   Mouth: Mucous membranes are moist.  ?   Pharynx: Oropharynx is clear.  ?Eyes:  ?   Conjunctiva/sclera: Conjunctivae normal.  ?Cardiovascular:  ?   Rate and Rhythm: Normal rate and regular rhythm.  ?Pulmonary:  ?   Effort: Pulmonary effort is normal.  ?   Breath sounds: Normal breath sounds.  ?Lymphadenopathy:  ?   Cervical: No cervical adenopathy.  ?Skin: ?   General: Skin is warm and dry.  ?   Findings: Rash present.  ?   Comments: Chronic hyperpigmented skin rashes some in linear patches, 2 open sores without significant drainage, no surrounding induration or larger area of erythema  ?Neurological:  ?   Mental Status: She is alert.  ?Psychiatric:     ?   Mood and Affect: Mood normal.  ?  ? ?Musculoskeletal Exam:  ?Shoulders full ROM no tenderness or swelling ?Elbows full ROM no tenderness or swelling ?Wrists slightly decreased ROM, no tenderness or swelling ?Fingers full ROM no tenderness or swelling ?Knees full ROM no tenderness or swelling, mild crepitus present b/l ?Ankles full ROM no tenderness or swelling ? ? ?CDAI Exam: ?CDAI Score: 3  ?Patient Global: 20 mm; Provider Global: 10 mm ?Swollen: 0 ; Tender: 0  ?Joint Exam 07/31/2021  ? ?All documented joints were normal  ? ? ? ?Investigation: ?No additional findings. ? ?Imaging: ?No results found. ? ?Recent Labs: ?Lab Results  ?Component Value Date  ? WBC 8.3 06/11/2021  ? HGB 13.0 06/11/2021  ? PLT 331 06/11/2021  ? NA 139 06/11/2021  ? K 5.2 (H) 06/11/2021  ? CL 110 06/11/2021  ? CO2 21 (L) 06/11/2021  ?  GLUCOSE 97 06/11/2021  ? BUN 6 06/11/2021  ? CREATININE 0.84 06/11/2021  ? BILITOT 1.7 (H) 06/11/2021  ? ALKPHOS 41 06/11/2021  ? AST 40 06/11/2021  ? ALT 18 06/11/2021  ? PROT 6.4 (L) 06/11/2021  ? ALBUMIN 3.4 (L) 06/11/2021  ? CALCIUM 8.6 (L) 06/11/2021  ? GFRAA 144 09/13/2020  ? QFTBGOLDPLUS NEGATIVE 12/27/2020  ? ? ?Speciality Comments: Diagnosed in 2010. ?Combination of tocilizumab IV q2wk and MTX-initiation unclear-d/c 09/2012 ?Combination Orencia IV q4wk, Kineret sq daily, MTX-7/14-4/15 ?Tried injectable MTX 30 mg weekly-d/c SE and inefficacy  ?Remicade infusions 10 mg/kg every 4 weeks ?Tried SSZ in 2018 and again 2019-d/c both times due to rash ?Arava-05/16/17-01/06/18-Inadequate response ?Morrie Sheldon started 01/08/18 ? ?Procedures:  ?No procedures performed ?Allergies: Sulfasalazine  ? 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 ?Symptoms appear well controlled very little disease activity no synovitis on exam today. Some increased aches and stiffness starting back between infusions. Also skin disease in mild exacerbation  could also indicate not completely controlled. Plan to continue remicade 1000 mg q6wks and rinvoq 15 mg PO daily. ? ?High risk medication use ? ?Most recent CBC and CMP okay at March infusion, most recent sed rate was normal not repeating today with no phlebotomy also just started an antibiotic. ? ?Hidradenitis suppurativa ? ?Just started oral doxycycline for this 1 day ago too early to see effect or not. Review importance of sun protection on this medicine. No additional changes recommended at this time. ? ?Orders: ?No orders of the defined types were placed in this encounter. ? ?No orders of the defined types were placed in this encounter. ? ? ? ?Follow-Up Instructions: Return in about 4 months (around 12/01/2021) for [name]/HS f/u 73mos. ? ? ?Collier Salina, MD ? ?Note - This record has been created using [employer].  ?Chart creation errors  have been sought, but may not always  ?have been located. Such creation errors do not reflect on  ?the standard of medical care. ? ?

## 2021-07-31 ENCOUNTER — Encounter: Payer: Self-pay | Admitting: Internal Medicine

## 2021-07-31 ENCOUNTER — Ambulatory Visit (INDEPENDENT_AMBULATORY_CARE_PROVIDER_SITE_OTHER): Payer: Medicaid Other | Admitting: Internal Medicine

## 2021-07-31 VITALS — BP 137/84 | HR 86 | Resp 15 | Ht 59.0 in | Wt 250.0 lb

## 2021-07-31 DIAGNOSIS — L732 Hidradenitis suppurativa: Secondary | ICD-10-CM | POA: Diagnosis not present

## 2021-07-31 DIAGNOSIS — Z79899 Other long term (current) drug therapy: Secondary | ICD-10-CM | POA: Diagnosis not present

## 2021-07-31 DIAGNOSIS — M0880 Other juvenile arthritis, unspecified site: Secondary | ICD-10-CM | POA: Diagnosis not present

## 2021-08-06 ENCOUNTER — Ambulatory Visit (HOSPITAL_COMMUNITY): Payer: Medicaid Other

## 2021-08-07 ENCOUNTER — Encounter: Payer: Self-pay | Admitting: Dermatology

## 2021-08-24 ENCOUNTER — Telehealth: Payer: Self-pay | Admitting: Pharmacist

## 2021-08-24 NOTE — Telephone Encounter (Signed)
Received fax from  [facility] stating patient's Rinvoq requires prior authorization.  Submitted a Prior Authorization RENEWAL request to [facility] MEDICAID for [facility] via CoverMyMeds. Will update once we receive a response.  Key: [id number]  May denied again because patient has not tried and failed Enbrel or Humira.  Chesley Mires, PharmD, MPH, BCPS, CPP Clinical Pharmacist (Rheumatology and Pulmonology)

## 2021-08-28 NOTE — Telephone Encounter (Signed)
Received notification from [facility] MEDICAID regarding a prior authorization for [facility]. Authorization has been APPROVED from 08/24/21 to 08/25/22.   Patient can continue to fill through  [facility] pharmacy. Sent fax back to [employer] to reprocess rx  Authorization # [id number]  Chesley Mires, PharmD, MPH, BCPS, CPP Clinical Pharmacist (Rheumatology and Pulmonology)

## 2021-09-03 ENCOUNTER — Ambulatory Visit (HOSPITAL_COMMUNITY)
Admission: RE | Admit: 2021-09-03 | Discharge: 2021-09-03 | Disposition: A | Discharge status: Home / Self Care | Payer: Medicaid Other | Source: Ambulatory Visit | Attending: Internal Medicine | Admitting: Internal Medicine

## 2021-09-03 VITALS — BP 122/80 | HR 96 | Temp 98.6°F | Resp 18 | Wt 252.0 lb

## 2021-09-03 DIAGNOSIS — Z79899 Other long term (current) drug therapy: Secondary | ICD-10-CM | POA: Insufficient documentation

## 2021-09-03 DIAGNOSIS — M0880 Other juvenile arthritis, unspecified site: Secondary | ICD-10-CM | POA: Diagnosis present

## 2021-09-03 DIAGNOSIS — L732 Hidradenitis suppurativa: Secondary | ICD-10-CM | POA: Insufficient documentation

## 2021-09-03 LAB — CBC WITH DIFFERENTIAL/PLATELET
Abs Immature Granulocytes: 0.04 10*3/uL (ref 0.00–0.07)
Basophils Absolute: 0 10*3/uL (ref 0.0–0.1)
Basophils Relative: 0 %
Eosinophils Absolute: 0.1 10*3/uL (ref 0.0–0.5)
Eosinophils Relative: 1 %
HCT: 38.5 % (ref 36.0–46.0)
Hemoglobin: 13 g/dL (ref 12.0–15.0)
Immature Granulocytes: 1 %
Lymphocytes Relative: 41 %
Lymphs Abs: 3.6 10*3/uL (ref 0.7–4.0)
MCH: 30.4 pg (ref 26.0–34.0)
MCHC: 33.8 g/dL (ref 30.0–36.0)
MCV: 90 fL (ref 80.0–100.0)
Monocytes Absolute: 1.1 10*3/uL — ABNORMAL HIGH (ref 0.1–1.0)
Monocytes Relative: 12 %
Neutro Abs: 3.9 10*3/uL (ref 1.7–7.7)
Neutrophils Relative %: 45 %
Platelets: 311 10*3/uL (ref 150–400)
RBC: 4.28 MIL/uL (ref 3.87–5.11)
RDW: 14.1 % (ref 11.5–15.5)
WBC: 8.7 10*3/uL (ref 4.0–10.5)
nRBC: 0 % (ref 0.0–0.2)

## 2021-09-03 LAB — COMPREHENSIVE METABOLIC PANEL
ALT: 20 U/L (ref 0–44)
AST: 20 U/L (ref 15–41)
Albumin: 3.6 g/dL (ref 3.5–5.0)
Alkaline Phosphatase: 48 U/L (ref 38–126)
Anion gap: 4 — ABNORMAL LOW (ref 5–15)
BUN: 8 mg/dL (ref 6–20)
CO2: 23 mmol/L (ref 22–32)
Calcium: 9.1 mg/dL (ref 8.9–10.3)
Chloride: 108 mmol/L (ref 98–111)
Creatinine, Ser: 0.83 mg/dL (ref 0.44–1.00)
GFR, Estimated: >60 mL/min (ref >60)
Glucose, Bld: 90 mg/dL (ref 70–99)
Potassium: 3.9 mmol/L (ref 3.5–5.1)
Sodium: 135 mmol/L (ref 135–145)
Total Bilirubin: 0.7 mg/dL (ref 0.3–1.2)
Total Protein: 7.1 g/dL (ref 6.5–8.1)

## 2021-09-03 MED ORDER — DIPHENHYDRAMINE HCL 25 MG PO CAPS
ORAL_CAPSULE | ORAL | Status: AC
  Filled 2021-09-03: qty 1

## 2021-09-03 MED ORDER — ACETAMINOPHEN 325 MG PO TABS
ORAL_TABLET | ORAL | Status: AC
  Filled 2021-09-03: qty 2

## 2021-09-03 MED ORDER — METHYLPREDNISOLONE SODIUM SUCC 40 MG IJ SOLR
INTRAMUSCULAR | Status: AC
  Filled 2021-09-03: qty 1

## 2021-09-03 MED ORDER — ACETAMINOPHEN 325 MG PO TABS
650.0000 mg | ORAL_TABLET | ORAL | Status: DC
  Administered 2021-09-03: 650 mg via ORAL

## 2021-09-03 MED ORDER — SODIUM CHLORIDE 0.9 % IV SOLN
1000.0000 mg | INTRAVENOUS | Status: DC
  Administered 2021-09-03: 1000 mg via INTRAVENOUS
  Filled 2021-09-03: qty 100

## 2021-09-03 MED ORDER — METHYLPREDNISOLONE SODIUM SUCC 40 MG IJ SOLR
40.0000 mg | INTRAMUSCULAR | Status: DC
  Administered 2021-09-03: 40 mg via INTRAVENOUS

## 2021-09-03 MED ORDER — DIPHENHYDRAMINE HCL 25 MG PO CAPS
25.0000 mg | ORAL_CAPSULE | ORAL | Status: DC
  Administered 2021-09-03: 25 mg via ORAL

## 2021-09-04 NOTE — Progress Notes (Signed)
Lab results look good no problems for continuing the remicade and rinvoq.

## 2021-10-15 ENCOUNTER — Ambulatory Visit (HOSPITAL_COMMUNITY)
Admission: RE | Admit: 2021-10-15 | Discharge: 2021-10-15 | Disposition: A | Discharge status: Home / Self Care | Payer: Medicaid Other | Source: Ambulatory Visit | Attending: Internal Medicine | Admitting: Internal Medicine

## 2021-10-15 ENCOUNTER — Telehealth: Payer: Self-pay | Admitting: Pharmacist

## 2021-10-15 DIAGNOSIS — M0880 Other juvenile arthritis, unspecified site: Secondary | ICD-10-CM

## 2021-10-15 MED ORDER — METHYLPREDNISOLONE SODIUM SUCC 40 MG IJ SOLR
INTRAMUSCULAR | Status: AC
  Administered 2021-10-15: 40 mg via INTRAVENOUS
  Filled 2021-10-15: qty 1

## 2021-10-15 MED ORDER — ACETAMINOPHEN 325 MG PO TABS: 650.0000 mg | ORAL_TABLET | ORAL | Status: DC

## 2021-10-15 MED ORDER — ACETAMINOPHEN 325 MG PO TABS
ORAL_TABLET | ORAL | Status: AC
  Administered 2021-10-15: 650 mg via ORAL
  Filled 2021-10-15: qty 2

## 2021-10-15 MED ORDER — METHYLPREDNISOLONE SODIUM SUCC 40 MG IJ SOLR: 40.0000 mg | INTRAMUSCULAR | Status: DC

## 2021-10-15 MED ORDER — SODIUM CHLORIDE 0.9 % IV SOLN
1000.0000 mg | INTRAVENOUS | Status: DC
  Administered 2021-10-15: 1000 mg via INTRAVENOUS
  Filled 2021-10-15: qty 100

## 2021-10-15 MED ORDER — DIPHENHYDRAMINE HCL 25 MG PO CAPS: 25.0000 mg | ORAL_CAPSULE | ORAL | Status: DC

## 2021-10-15 MED ORDER — DIPHENHYDRAMINE HCL 25 MG PO CAPS
ORAL_CAPSULE | ORAL | Status: AC
  Administered 2021-10-15: 25 mg via ORAL
  Filled 2021-10-15: qty 1

## 2021-10-15 NOTE — Telephone Encounter (Signed)
Noted. Will close encounter  Chesley Mires, PharmD, MPH, BCPS, CPP Clinical Pharmacist (Rheumatology and Pulmonology)

## 2021-10-15 NOTE — Progress Notes (Signed)
Patient here today for IV Remicade. Remains on oral doxycycline since May and will continue taking until August for "boils". Prescribed by dermatologist. Patient received Remicade in June. Denies fevers. Okay to proceed with infusion today per [facility], PharmD with Dr. Gregary Cromer office (rheumatology).

## 2021-10-15 NOTE — Telephone Encounter (Signed)
I think it will be fine to continue medications concurrently.

## 2021-10-15 NOTE — Telephone Encounter (Signed)
[facility] at [facility] today to receive Remicade infusion. Received message from nurse requesting clearance to proceed with infusion since patient is currently taking doxycycline for HS.  Completing course in August 2023 per RN.  Patient received infusion in June 2023 - at that point she as on doxycycline since May 2023. Dr. Dimple Casey had cleared for infusion due to risk vs benefit for PJIA flare. Remicade may have benefit for her HS however she is also taking Rinvoq.  Will route to Dr. Dimple Casey if patient is to hold Rinvoq at this point and throuhg the completion of doxycycline. She has f/u with derm on 11/01/21  Chesley Mires, PharmD, MPH, BCPS, CPP Clinical Pharmacist (Rheumatology and Pulmonology)

## 2021-10-22 ENCOUNTER — Other Ambulatory Visit: Payer: Self-pay | Admitting: Internal Medicine

## 2021-10-22 DIAGNOSIS — M0880 Other juvenile arthritis, unspecified site: Secondary | ICD-10-CM

## 2021-10-22 NOTE — Telephone Encounter (Signed)
Next Visit: 12/11/2021  Last Visit: 07/31/2021  Last Fill:   DX: Polyarticular juvenile idiopathic arthritis   Current Dose per office note 07/31/2021: rinvoq 15 mg PO daily  Labs: 09/03/2021 Lab results look good no problems for continuing the remicade and rinvoq.  TB Gold: 12/27/2020 Neg    Okay to refill Rinvoq?

## 2021-10-23 ENCOUNTER — Other Ambulatory Visit: Payer: Self-pay | Admitting: Internal Medicine

## 2021-10-23 DIAGNOSIS — M0880 Other juvenile arthritis, unspecified site: Secondary | ICD-10-CM

## 2021-10-23 NOTE — Telephone Encounter (Signed)
Just signed refill request in different tab so closing this one. Thanks.

## 2021-10-25 ENCOUNTER — Emergency Department (HOSPITAL_COMMUNITY)
Admission: EM | Admit: 2021-10-25 | Discharge: 2021-10-26 | Disposition: A | Discharge status: Home / Self Care | Payer: Medicaid Other | Attending: Emergency Medicine | Admitting: Emergency Medicine

## 2021-10-25 ENCOUNTER — Other Ambulatory Visit: Payer: Self-pay

## 2021-10-25 DIAGNOSIS — L03112 Cellulitis of left axilla: Secondary | ICD-10-CM | POA: Diagnosis not present

## 2021-10-25 DIAGNOSIS — R000 Tachycardia, unspecified: Secondary | ICD-10-CM | POA: Insufficient documentation

## 2021-10-25 DIAGNOSIS — L02412 Cutaneous abscess of left axilla: Secondary | ICD-10-CM | POA: Insufficient documentation

## 2021-10-25 MED ORDER — OXYCODONE-ACETAMINOPHEN 5-325 MG PO TABS
1.0000 | ORAL_TABLET | Freq: Once | ORAL | Status: AC
  Administered 2021-10-25: 1 via ORAL
  Filled 2021-10-25: qty 1

## 2021-10-25 MED ORDER — PENTAFLUOROPROP-TETRAFLUOROETH EX AERO
INHALATION_SPRAY | Freq: Once | CUTANEOUS | Status: AC
Start: 2021-10-25 — End: 2021-10-25
  Filled 2021-10-25: qty 116

## 2021-10-25 MED ORDER — HYDROCODONE-ACETAMINOPHEN 5-325 MG PO TABS: 1.0000 | ORAL_TABLET | Freq: Four times a day (QID) | ORAL | 0 refills | Status: DC | PRN

## 2021-10-25 NOTE — ED Notes (Signed)
Pt has a hx of abscesses. Noted red, warm to the touch, swollen area to the lt armpit x 1 week

## 2021-10-25 NOTE — ED Provider Triage Note (Signed)
Emergency Medicine Provider Triage Evaluation Note  Angie Stone , a 21 y.o. female  was evaluated in triage.  Pt complains of left axilla abscess for the last 3 days.  No improvement despite warm compresses, pain medication.  Prior drainage in the past of similar wounds..  Review of Systems  Positive: Wound Negative: Fever  Physical Exam  BP 122/87   Pulse (!) 129   Temp 98.5 F (36.9 C) (Oral)   Resp 18   Ht 4\' 11"  (1.499 m)   Wt 113.4 kg   SpO2 92%   BMI 50.49 kg/m  Gen:   Awake, no distress   Resp:  Normal effort  MSK:   Moves extremities without difficulty  Other:  Large fluctuance under the left axilla with surrounding erythema very tender to palpation.  Medical Decision Making  Medically screening exam initiated at 3:18 PM.  Appropriate orders placed.  Angie Stone was informed that the remainder of the evaluation will be completed by another provider, this initial triage assessment does not replace that evaluation, and the importance of remaining in the ED until their evaluation is complete.  afebrile   [name], Claude Manges 10/25/21 1520

## 2021-10-25 NOTE — ED Notes (Signed)
Provider at bedside at this time

## 2021-10-25 NOTE — ED Provider Notes (Signed)
[facility] EMERGENCY DEPARTMENT Provider Note   CSN: [id number] Arrival date & time: 10/25/21  1455     History  Chief Complaint  Patient presents with   Abscess    Angie Stone is a 21 y.o. female.  Patient is a 21 year old female with a history of juvenile arthritis, immunosuppression as she is on Remicade, hydroadenitis with recurrent axillary abscesses who is presenting today with an abscess that has been present under the left axilla for approximately 1 week that is gradually worsening.  She has not had fever, chills or systemic illness.  The pain is just becoming more severe.  She is noticing some spreading redness of the area.  She has been trying to use warm compresses but it is not draining.  The history is provided by the patient.  Abscess      Home Medications Prior to Admission medications   Medication Sig Start Date End Date Taking? Authorizing Provider  HYDROcodone-acetaminophen (NORCO/VICODIN) 5-325 MG tablet Take 1 tablet by mouth every 6 (six) hours as needed for severe pain. 10/25/21  Yes Gwyneth Sprout, MD  diphenhydrAMINE (BENADRYL) 25 MG tablet Take 1 tablet (25 mg total) by mouth every 6 (six) hours. Patient taking differently: Take 25 mg by mouth. With Remicade infusions 04/13/17   Everlene Farrier, PA-C  doxycycline (MONODOX) 50 MG capsule Take 1 capsule (50 mg total) by mouth daily. Take with evening food and drink 07/30/21   Deirdre Evener, MD  inFLIXimab (REMICADE IV) Inject 1,000 mg into the vein every 6 (six) weeks.    [provider]  medroxyPROGESTERone (DEPO-PROVERA) 150 MG/ML injection Inject into the muscle every 3 (three) months. 03/14/20   [provider]  RINVOQ 15 MG TB24 TAKE 1 TABLET BY MOUTH DAILY 10/23/21   Rice, Jamesetta Orleans, MD      Allergies    Sulfasalazine    Review of Systems   Review of Systems  Physical Exam Updated Vital Signs BP 131/79 (BP Location: Right Arm)   Pulse (!) 118    Temp 99.8 F (37.7 C) (Oral)   Resp 16   Ht 4\' 11"  (1.499 m)   Wt 113.4 kg   SpO2 100%   BMI 50.49 kg/m  Physical Exam Vitals and nursing note reviewed.  Constitutional:      General: She is not in acute distress.    Appearance: Normal appearance. She is obese.  HENT:     Head: Normocephalic.     Mouth/Throat:     Mouth: Mucous membranes are moist.  Cardiovascular:     Rate and Rhythm: Tachycardia present.  Pulmonary:     Effort: Pulmonary effort is normal.  Musculoskeletal:     Cervical back: Normal range of motion and neck supple.  Skin:    General: Skin is warm and dry.       Neurological:     Mental Status: She is alert. Mental status is at baseline.  Psychiatric:        Mood and Affect: Mood normal.        Behavior: Behavior normal.     ED Results / Procedures / Treatments   Labs (all labs ordered are listed, but only abnormal results are displayed) Labs Reviewed - No data to display  EKG None  Radiology No results found.  Procedures Procedures   INCISION AND DRAINAGE Performed by: Consent: Verbal consent obtained. Risks and benefits: risks, benefits and alternatives were discussed Type: abscess  Body area: left axilla  Anesthesia: local infiltration  Incision was made with a scalpel.  Local anesthetic: lidocaine 2% with epinephrine  Anesthetic total: 5 ml  Complexity: complex Blunt dissection to break up loculations  Drainage: purulent  Drainage amount: 54mL  Packing material: 1/4 in iodoform gauze  Patient tolerance: Patient tolerated the procedure well with no immediate complications.   Medications Ordered in ED Medications  oxyCODONE-acetaminophen (PERCOCET/ROXICET) 5-325 MG per tablet 1 tablet (1 tablet Oral Given 10/25/21 2248)  pentafluoroprop-tetrafluoroeth (GEBAUERS) aerosol ( Topical Given 10/25/21 2248)    ED Course/ Medical Decision Making/ A&P                           Medical Decision  Making Amount and/or Complexity of Data Reviewed External Data Reviewed: notes.  Risk Prescription drug management.   Patient presenting today with recurrent left axillary abscess.  There is some surrounding cellulitis but patient denies systemic symptoms.  Patient is noted to be tachycardic here with a temperature of 99.8 but she denies any fevers at home.  Patient is immunosuppressed on Remicade for her juvenile arthritis.  Patient given pain control.  I&D as above.  Pt is already on doxycycline and will have her continue this abx.  She was given ppx for pain control and gen surgery follow as maybe she is a candidate for more permanent fix.  She was given return precautions.        Final Clinical Impression(s) / ED Diagnoses Final diagnoses:  Abscess of left axilla    Rx / DC Orders ED Discharge Orders          Ordered    HYDROcodone-acetaminophen (NORCO/VICODIN) 5-325 MG tablet  Every 6 hours PRN        10/25/21 2314              Gwyneth Sprout, MD 10/25/21 2317

## 2021-10-25 NOTE — ED Triage Notes (Signed)
Pt arrived POV from home c/o an abscess under her left armpit that she states needs to be drained. Pt noticed it about a week ago and has not been seen or taken anything.

## 2021-10-25 NOTE — Discharge Instructions (Signed)
Continue your doxycycline that you are already taking.  If the packing is still present on Sunday you can pull it out.  You can continue to do warm compresses to the area.  If you start running a high fever or redness appears to be getting worse despite the drainage return to the emergency room.  I expect that you will feel much better tomorrow.

## 2021-11-01 ENCOUNTER — Ambulatory Visit (INDEPENDENT_AMBULATORY_CARE_PROVIDER_SITE_OTHER): Payer: Medicaid Other | Admitting: Dermatology

## 2021-11-01 DIAGNOSIS — Z79899 Other long term (current) drug therapy: Secondary | ICD-10-CM | POA: Diagnosis not present

## 2021-11-01 DIAGNOSIS — L732 Hidradenitis suppurativa: Secondary | ICD-10-CM | POA: Diagnosis not present

## 2021-11-01 MED ORDER — DOXYCYCLINE MONOHYDRATE 100 MG PO CAPS: 100.0000 mg | ORAL_CAPSULE | Freq: Every day | ORAL | 1 refills | Status: DC

## 2021-11-01 NOTE — Progress Notes (Signed)
   Follow-Up Visit   Subjective  Angie Stone is a 21 y.o. female who presents for the following: hidradenitis suppurativa (Patient reports some improvement, patient report having to go to ED last Thursday to get boil drained under axilla. Patient reports feels area is healing up. Patient was unable to get CLN wash due to cost. ).  The following portions of the chart were reviewed this encounter and updated as appropriate:  Tobacco  Allergies  Meds  Problems  Med Hx  Surg Hx  Fam Hx     Review of Systems: No other skin or systemic complaints except as noted in HPI or Assessment and Plan.  Objective  Well appearing patient in no apparent distress; mood and affect are within normal limits.  A focused examination was performed including b/l axilla . Relevant physical exam findings are noted in the Assessment and Plan.  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 It can be associated with severe scarring acne and cysts; also abscesses and scarring of scalp. The goal is control and prevention of flares, as it is not curable. Scars are permanent and can be thickened. Treatment may include daily use of topical medication and oral antibiotics.  Oral isotretinoin may also be helpful.  For more severe cases, Humira (a biologic injection) may be prescribed to decrease the inflammatory process and prevent flares.  When indicated, inflamed cysts may also be treated surgically. Chronic and persistent condition with duration or expected duration over one year. Condition is bothersome/symptomatic for patient. Currently flared.  Increase  Doxycycline 50 mg capsule to Doxycycline 100 mg capsule by mouth qd with food and drink. Doxycycline should be taken with food to prevent nausea. Do not lay down for 30 minutes after taking. Be cautious with sun exposure and use good sun protection while on this medication. Pregnant women should not take this medication.   Patient unable to get CLN wash , recommend starting Joesoef sulfar bar, Grisi Sulfar soap, or [facility] advanced acne soap , use qd as wash to aa's.   Information given in patient's handout If still getting same number of breakouts will discuss accutane at next visit   Long term medication management.  Patient is using long term (months to years) prescription medication  to control their dermatologic condition.  These medications require periodic monitoring to evaluate for efficacy and side effects and may require periodic laboratory monitoring.  doxycycline (MONODOX) 100 MG capsule - b/l axillary and b/l inframammary Take 1 capsule (100 mg total) by mouth daily. Take with food and drink. Do not lay down for 30 minutes after taking. Be cautious with sun exposure and use good sun protection while on this medication.  Return in about 3 months (around 02/01/2022) for HS follow up. IAsher Muir, CMA, am acting as scribe for Armida Sans, MD. Documentation: I have reviewed the above documentation for accuracy and completeness, and I agree with the above.  Armida Sans, MD

## 2021-11-01 NOTE — Patient Instructions (Addendum)
Start doxycycline 100 mg tab by mouth once daily with food and drink  Doxycycline should be taken with food to prevent nausea. Do not lay down for 30 minutes after taking. Be cautious with sun exposure and use good sun protection while on this medication. Pregnant women should not take this medication.   Can use over the counter [employer] , [employer] , or [employer] Maximum Strength Acne Cleansing Bar  use as wash daily to affected areas under arms.    Due to recent changes in healthcare laws, you may see results of your pathology and/or laboratory studies on MyChart before the doctors have had a chance to review them. We understand that in some cases there may be results that are confusing or concerning to you. Please understand that not all results are received at the same time and often the doctors may need to interpret multiple results in order to provide you with the best plan of care or course of treatment. Therefore, we ask that you please give [place] 2 business days to thoroughly review all your results before contacting the office for clarification. Should we see a critical lab result, you will be contacted sooner.   If You Need Anything After Your Visit  If you have any questions or concerns for your doctor, please call our main line at [phone] and press option 4 to reach your doctor's medical assistant. If no one answers, please leave a voicemail as directed and we will return your call as soon as possible. Messages left after 4 pm will be answered the following business day.   You may also send [place] a message via MyChart. We typically respond to MyChart messages within 1-2 business days.  For prescription refills, please ask your pharmacy to contact our office. Our fax number is [phone].  If you have an urgent issue when the clinic is closed that cannot wait until the next business day, you can page your doctor at the number below.    Please note that while we do our  best to be available for urgent issues outside of office hours, we are not available 24/7.   If you have an urgent issue and are unable to reach [place], you may choose to seek medical care at your doctor's office, retail clinic, urgent care center, or emergency room.  If you have a medical emergency, please immediately call 911 or go to the emergency department.  Pager Numbers  - Dr. Gwen Pounds: [phone]  - Dr. Neale Burly: [phone]  - Dr. Roseanne Reno: [phone]  In the event of inclement weather, please call our main line at [phone] for an update on the status of any delays or closures.  Dermatology Medication Tips: Please keep the boxes that topical medications come in in order to help keep track of the instructions about where and how to use these. Pharmacies typically print the medication instructions only on the boxes and not directly on the medication tubes.   If your medication is too expensive, please contact our office at [phone] option 4 or send [place] a message through MyChart.   We are unable to tell what your co-pay for medications will be in advance as this is different depending on your insurance coverage. However, we may be able to find a substitute medication at lower cost or fill out paperwork to get insurance to cover a needed medication.   If a prior authorization is required to get your medication covered by your insurance company, please allow  [place] 1-2 business days to complete this process.  Drug prices often vary depending on where the prescription is filled and some pharmacies may offer cheaper prices.  The website www.goodrx.com contains coupons for medications through different pharmacies. The prices here do not account for what the cost may be with help from insurance (it may be cheaper with your insurance), but the website can give you the price if you did not use any insurance.  - You can print the associated coupon and take it with your prescription to the  pharmacy.  - You may also stop by our office during regular business hours and pick up a GoodRx coupon card.  - If you need your prescription sent electronically to a different pharmacy, notify our office through [facility] or by phone at [phone] option 4.     Si Usted Necesita Algo Despus de Su Visita  Tambin puede enviarnos un mensaje a travs de Pharmacist, community. Por lo general respondemos a los mensajes de MyChart en el transcurso de 1 a 2 das hbiles.  Para renovar recetas, por favor pida a su farmacia que se ponga en contacto con nuestra oficina. Harland Dingwall de fax es [place] [phone].  Si tiene un asunto urgente cuando la clnica est cerrada y que no puede esperar hasta el siguiente da hbil, puede llamar/localizar a su doctor(a) al nmero que aparece a continuacin.   Por favor, tenga en cuenta que aunque hacemos todo lo posible para estar disponibles para asuntos urgentes fuera del horario de [place], no estamos disponibles las 24 horas del da, los 7 das de la [place].   Si tiene un problema urgente y no puede comunicarse con nosotros, puede optar por buscar atencin mdica  en el consultorio de su doctor(a), en una clnica privada, en un centro de atencin urgente o en una sala de emergencias.  Si tiene Engineering geologist, por favor llame inmediatamente al 911 o vaya a la sala de emergencias.  Nmeros de bper  - Dr. Nehemiah Massed: [phone]  - Dra. Moye: [phone]  - Dra. Nicole Kindred: [phone]  En caso de inclemencias del [place], por favor llame a Johnsie Kindred principal al [phone] para una actualizacin sobre el [facility] de cualquier retraso o cierre.  Consejos para la medicacin en dermatologa: Por favor, guarde las cajas en las que vienen los medicamentos de uso tpico para ayudarle a seguir las instrucciones sobre dnde y cmo usarlos. Las farmacias generalmente imprimen las instrucciones del medicamento slo en las cajas y no directamente en los  tubos del [place].   Si su medicamento es muy caro, por favor, pngase en contacto con Zigmund Daniel llamando al [phone] y presione la opcin 4 o envenos un mensaje a travs de Pharmacist, community.   No podemos decirle cul ser su copago por los medicamentos por adelantado ya que esto es diferente dependiendo de la cobertura de su seguro. Sin embargo, es posible que podamos encontrar un medicamento sustituto a Electrical engineer un formulario para que el seguro cubra el medicamento que se considera necesario.   Si se requiere una autorizacin previa para que su compaa de seguros [place] su medicamento, por favor permtanos de 1 a 2 das hbiles para completar este proceso.  Los precios de los medicamentos varan con frecuencia dependiendo del Environmental consultant de dnde se surte la receta y alguna farmacias pueden ofrecer precios ms baratos.  El sitio web www.goodrx.com tiene cupones para medicamentos de Airline pilot. Los precios aqu no tienen en cuenta lo que podra  costar con la ayuda del seguro (puede ser ms barato con su seguro), pero el sitio web puede darle el precio si no Field seismologist.  - Puede imprimir el cupn correspondiente y llevarlo con su receta a la farmacia.  - Tambin puede pasar por nuestra oficina durante el horario de atencin regular y Charity fundraiser una tarjeta de cupones de GoodRx.  - Si necesita que su receta se enve electrnicamente a una farmacia diferente, informe a nuestra oficina a travs de MyChart de  o por telfono llamando al [phone] y presione la opcin 4.

## 2021-11-03 ENCOUNTER — Encounter: Payer: Self-pay | Admitting: Dermatology

## 2021-11-07 ENCOUNTER — Encounter: Payer: Self-pay | Admitting: Internal Medicine

## 2021-11-08 NOTE — Telephone Encounter (Signed)
I am fine with trying to move up her appointment to any time we have available sooner since there is a new problem.  [facility]) - I have no problems with continuing her schedule remicade infusion while on this dose of doxycycline which is being used for active hydradenitis suppurativa and not any acute bacterial infection.

## 2021-11-15 ENCOUNTER — Telehealth: Payer: Self-pay | Admitting: Internal Medicine

## 2021-11-15 NOTE — Telephone Encounter (Signed)
FYI:  Called patient to let her know she is on a cancellation list and would be called if a sooner appointment became available.

## 2021-11-23 ENCOUNTER — Encounter: Payer: Self-pay | Admitting: Dermatology

## 2021-11-23 ENCOUNTER — Other Ambulatory Visit: Payer: Self-pay | Admitting: Pharmacist

## 2021-11-23 DIAGNOSIS — M0880 Other juvenile arthritis, unspecified site: Secondary | ICD-10-CM

## 2021-11-23 DIAGNOSIS — Z111 Encounter for screening for respiratory tuberculosis: Secondary | ICD-10-CM

## 2021-11-23 DIAGNOSIS — Z79899 Other long term (current) drug therapy: Secondary | ICD-10-CM

## 2021-11-23 NOTE — Progress Notes (Signed)
Next infusion scheduled for Remicade on 11/26/21 and due for updated orders. Patient continues on Rinvoq 15mg  once daily in combination with Remicade.  Diagnosis: PJIA  Dose: 1000mg  IV every 6 weeks  Last Clinic Visit: 07/31/21 Next Clinic Visit: 12/11/21  Last infusion: 10/15/21  Labs: CBC and CMP on 09/03/21  - stable TB Gold: negative on 12/27/20  Orders placed for Remicade x 2 doses along with premedication of acetaminophen, methylprednisolone 40mg , and diphenhydramine to be administered 30 minutes before medication infusion.  Standing CBC with diff/platelet and CMP with GFR orders placed to be drawn every other infusion.  Next TB gold due 12/27/21 (will need to be drawn with October 2023 infusion)  , PharmD, MPH, BCPS, CPP Clinical Pharmacist (Rheumatology and Pulmonology)

## 2021-11-26 ENCOUNTER — Ambulatory Visit (HOSPITAL_COMMUNITY)
Admission: RE | Admit: 2021-11-26 | Discharge: 2021-11-26 | Disposition: A | Discharge status: Home / Self Care | Payer: Medicaid Other | Source: Ambulatory Visit | Attending: Internal Medicine | Admitting: Internal Medicine

## 2021-11-26 DIAGNOSIS — M0880 Other juvenile arthritis, unspecified site: Secondary | ICD-10-CM | POA: Diagnosis present

## 2021-11-26 DIAGNOSIS — Z79899 Other long term (current) drug therapy: Secondary | ICD-10-CM

## 2021-11-26 MED ORDER — METHYLPREDNISOLONE SODIUM SUCC 40 MG IJ SOLR
INTRAMUSCULAR | Status: AC
  Administered 2021-11-26: 40 mg via INTRAVENOUS
  Filled 2021-11-26: qty 1

## 2021-11-26 MED ORDER — DIPHENHYDRAMINE HCL 25 MG PO CAPS
ORAL_CAPSULE | ORAL | Status: AC
  Administered 2021-11-26: 25 mg via ORAL
  Filled 2021-11-26: qty 1

## 2021-11-26 MED ORDER — ACETAMINOPHEN 325 MG PO TABS
ORAL_TABLET | ORAL | Status: AC
  Filled 2021-11-26: qty 1

## 2021-11-26 MED ORDER — SODIUM CHLORIDE 0.9 % IV SOLN
1000.0000 mg | INTRAVENOUS | Status: DC
  Administered 2021-11-26: 1000 mg via INTRAVENOUS
  Filled 2021-11-26: qty 100

## 2021-11-26 MED ORDER — METHYLPREDNISOLONE SODIUM SUCC 40 MG IJ SOLR: 40.0000 mg | INTRAMUSCULAR | Status: DC

## 2021-11-26 MED ORDER — ACETAMINOPHEN 325 MG PO TABS
ORAL_TABLET | ORAL | Status: AC
  Administered 2021-11-26: 650 mg via ORAL
  Filled 2021-11-26: qty 1

## 2021-11-26 MED ORDER — ACETAMINOPHEN 325 MG PO TABS: 650.0000 mg | ORAL_TABLET | ORAL | Status: DC

## 2021-11-26 MED ORDER — DIPHENHYDRAMINE HCL 25 MG PO CAPS: 25.0000 mg | ORAL_CAPSULE | ORAL | Status: DC

## 2021-11-29 ENCOUNTER — Ambulatory Visit (INDEPENDENT_AMBULATORY_CARE_PROVIDER_SITE_OTHER): Payer: Medicaid Other | Admitting: Dermatology

## 2021-11-29 DIAGNOSIS — L700 Acne vulgaris: Secondary | ICD-10-CM

## 2021-11-29 DIAGNOSIS — L732 Hidradenitis suppurativa: Secondary | ICD-10-CM | POA: Diagnosis not present

## 2021-11-29 DIAGNOSIS — Z79899 Other long term (current) drug therapy: Secondary | ICD-10-CM | POA: Diagnosis not present

## 2021-11-29 NOTE — Progress Notes (Signed)
Follow-Up Visit   Subjective  Angie Stone is a 21 y.o. female who presents for the following: Hidradenitis  (Bil axilla, bil inframammary, Doxycycline 100mg  1 po qd, pt does not feel like Doxycycline is helping). She feels like she may actually be worse new spots and drainage oral doxycycline treatment.  The following portions of the chart were reviewed this encounter and updated as appropriate:   Tobacco  Allergies  Meds  Problems  Med Hx  Surg Hx  Fam Hx     Review of Systems:  No other skin or systemic complaints except as noted in HPI or Assessment and Plan.  Objective  Well appearing patient in no apparent distress; mood and affect are within normal limits.  A focused examination was performed including bil axilla, inframammary. Relevant physical exam findings are noted in the Assessment and Plan.  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 Condition is symptomatic / bothersome to patient. Not to goal and flared.  Hidradenitis Suppurativa is a chronic; persistent; non-curable, but treatable condition due to abnormal inflamed sweat glands in the body folds (axilla, inframammary, groin, medial thighs), causing recurrent painful draining cysts and scarring. It can be associated with severe scarring acne and cysts; also abscesses and scarring of scalp. The goal is control and prevention of flares, as it is not curable. Scars are permanent and can be thickened. Treatment may include daily use of topical medication and oral antibiotics.  Oral isotretinoin may also be helpful.  For more severe cases, Humira (a biologic injection) may be prescribed to decrease the inflammatory process and prevent flares.  When indicated, inflamed cysts may also be treated  surgically.  Discussed Isotretinoin will not cure HS but should improve  Reviewed potential side effects of isotretinoin including xerosis, cheilitis, hepatitis, hyperlipidemia, and severe birth defects if taken by a pregnant woman. Reviewed reports of suicidal ideation in those with a history of depression while taking isotretinoin and reports of diagnosis of inflammatory bowl disease while taking isotretinoin as well as the lack of evidence for a causal relationship between isotretinoin, depression and IBD. Patient advised to reach out with any questions or concerns. Patient advised not to share pills or donate blood while on treatment or for one month after completing treatment.   Isotretinoin consents signed today Pt registered in [facility] program  Urine pregnancy test performed in office today and was negative.  Patient demonstrates comprehension and confirms she will not get pregnant.  Lot [facility] exp 12/18/2022  IPLEDGE 12/20/2022 2 forms of birth control Abstinence  Depo shot Pharmacy [facility] [address]   May cont Doxycycline 100mg  1 po qd until return for f/u  Doxycycline should be taken with food to prevent nausea. Do not lay down for 30 minutes after taking. Be cautious with sun exposure and use good sun protection while on this medication. Pregnant women should not take this medication.     Related Medications doxycycline (MONODOX) 100 MG capsule Take 1 capsule (100 mg total) by mouth daily. Take with food and drink. Do not lay down for 30 minutes after taking. Be cautious with sun exposure and use good sun protection while on this medication.  Acne vulgaris face  Discussed the Isotretinoin that she will start for HS will help her Acne   Return  in about 4 weeks (around 12/27/2021) for Isotretinoin start.  I, Ardis Rowan, RMA, am acting as scribe for Armida Sans, MD . Documentation: I have reviewed the above documentation for accuracy and  completeness, and I agree with the above.  Armida Sans, MD

## 2021-11-29 NOTE — Patient Instructions (Signed)
Due to recent changes in healthcare laws, you may see results of your pathology and/or laboratory studies on MyChart before the doctors have had a chance to review them. We understand that in some cases there may be results that are confusing or concerning to you. Please understand that not all results are received at the same time and often the doctors may need to interpret multiple results in order to provide you with the best plan of care or course of treatment. Therefore, we ask that you please give us 2 business days to thoroughly review all your results before contacting the office for clarification. Should we see a critical lab result, you will be contacted sooner.   If You Need Anything After Your Visit  If you have any questions or concerns for your doctor, please call our main line at [phone] and press option 4 to reach your doctor's medical assistant. If no one answers, please leave a voicemail as directed and we will return your call as soon as possible. Messages left after 4 pm will be answered the following business day.   You may also send us a message via MyChart. We typically respond to MyChart messages within 1-2 business days.  For prescription refills, please ask your pharmacy to contact our office. Our fax number is [phone].  If you have an urgent issue when the clinic is closed that cannot wait until the next business day, you can page your doctor at the number below.    Please note that while we do our best to be available for urgent issues outside of office hours, we are not available 24/7.   If you have an urgent issue and are unable to reach us, you may choose to seek medical care at your doctor's office, retail clinic, urgent care center, or emergency room.  If you have a medical emergency, please immediately call 911 or go to the emergency department.  Pager Numbers  - Dr. Kowalski: [phone]  - Dr. Moye: [phone]  - Dr. Stewart:  [phone]  In the event of inclement weather, please call our main line at [phone] for an update on the status of any delays or closures.  Dermatology Medication Tips: Please keep the boxes that topical medications come in in order to help keep track of the instructions about where and how to use these. Pharmacies typically print the medication instructions only on the boxes and not directly on the medication tubes.   If your medication is too expensive, please contact our office at [phone] option 4 or send us a message through MyChart.   We are unable to tell what your co-pay for medications will be in advance as this is different depending on your insurance coverage. However, we may be able to find a substitute medication at lower cost or fill out paperwork to get insurance to cover a needed medication.   If a prior authorization is required to get your medication covered by your insurance company, please allow us 1-2 business days to complete this process.  Drug prices often vary depending on where the prescription is filled and some pharmacies may offer cheaper prices.  The website www.goodrx.com contains coupons for medications through different pharmacies. The prices here do not account for what the cost may be with help from insurance (it may be cheaper with your insurance), but the website can give you the price if you did not use any insurance.  - You can print the associated coupon and take it with   your prescription to the pharmacy.  - You may also stop by our office during regular business hours and pick up a GoodRx coupon card.  - If you need your prescription sent electronically to a different pharmacy, notify our office through [facility] MyChart or by phone at [phone] option 4.     Si Usted Necesita Algo Despus de Su Visita  Tambin puede enviarnos un mensaje a travs de MyChart. Por lo general respondemos a los mensajes de MyChart en el transcurso de 1 a 2  das hbiles.  Para renovar recetas, por favor pida a su farmacia que se ponga en contacto con nuestra oficina. Nuestro nmero de fax es el [phone].  Si tiene un asunto urgente cuando la clnica est cerrada y que no puede esperar hasta el siguiente da hbil, puede llamar/localizar a su doctor(a) al nmero que aparece a continuacin.   Por favor, tenga en cuenta que aunque hacemos todo lo posible para estar disponibles para asuntos urgentes fuera del horario de oficina, no estamos disponibles las 24 horas del da, los 7 das de la semana.   Si tiene un problema urgente y no puede comunicarse con nosotros, puede optar por buscar atencin mdica  en el consultorio de su doctor(a), en una clnica privada, en un centro de atencin urgente o en una sala de emergencias.  Si tiene una emergencia mdica, por favor llame inmediatamente al 911 o vaya a la sala de emergencias.  Nmeros de bper  - Dr. Kowalski: [phone]  - Dra. Moye: [phone]  - Dra. Stewart: [phone]  En caso de inclemencias del tiempo, por favor llame a nuestra lnea principal al [phone] para una actualizacin sobre el estado de cualquier retraso o cierre.  Consejos para la medicacin en dermatologa: Por favor, guarde las cajas en las que vienen los medicamentos de uso tpico para ayudarle a seguir las instrucciones sobre dnde y cmo usarlos. Las farmacias generalmente imprimen las instrucciones del medicamento slo en las cajas y no directamente en los tubos del medicamento.   Si su medicamento es muy caro, por favor, pngase en contacto con nuestra oficina llamando al [phone] y presione la opcin 4 o envenos un mensaje a travs de MyChart.   No podemos decirle cul ser su copago por los medicamentos por adelantado ya que esto es diferente dependiendo de la cobertura de su seguro. Sin embargo, es posible que podamos encontrar un medicamento sustituto a menor costo o llenar un formulario para que el  seguro cubra el medicamento que se considera necesario.   Si se requiere una autorizacin previa para que su compaa de seguros cubra su medicamento, por favor permtanos de 1 a 2 das hbiles para completar este proceso.  Los precios de los medicamentos varan con frecuencia dependiendo del lugar de dnde se surte la receta y alguna farmacias pueden ofrecer precios ms baratos.  El sitio web www.goodrx.com tiene cupones para medicamentos de diferentes farmacias. Los precios aqu no tienen en cuenta lo que podra costar con la ayuda del seguro (puede ser ms barato con su seguro), pero el sitio web puede darle el precio si no utiliz ningn seguro.  - Puede imprimir el cupn correspondiente y llevarlo con su receta a la farmacia.  - Tambin puede pasar por nuestra oficina durante el horario de atencin regular y recoger una tarjeta de cupones de GoodRx.  - Si necesita que su receta se enve electrnicamente a una farmacia diferente, informe a nuestra oficina a travs de MyChart de [facility]   o por telfono llamando al [phone] y presione la opcin 4.  

## 2021-12-05 NOTE — Progress Notes (Signed)
Office Visit Note  Patient: Angie Stone             Date of Birth: July 11, 2000           MRN: [id number]             PCP: Selinda Orion Referring: Selinda Orion Visit Date: 12/11/2021   Subjective:  Pain of the Left Knee (Denies swelling. Patient describes pain as throbbing. ) and Numbness of the Right Thigh (Patient states she has a numb spot on the back of her thigh and has seen PCP for this. Patient states this has been going on since July 2023. )   History of Present Illness: Angie Stone is a 21 y.o. female here for follow up for inflammatory arthritis with history of [name] on remicade infusion 1000 mg q6wks and rinvoq 15 mg daily.  She has not had any significant flareup of arthritis symptoms since her last visit.  She still has knee pain most commonly at nighttime not associated with significant swelling.  She continues to have a small area of skin numbness over decree sensation on the back of the right leg above the level of the knee.  She did have worsening hidradenitis lesion in the left axilla that required drainage and had delayed wound healing for about a month.  Before that have been about a year since requiring any aspiration or drainage for a chest.  Otherwise she remains on the low-dose doxycycline treatment.  Previous HPI 07/31/2021 Angie Stone is a 21 y.o. female here for follow up for inflammatory arthritis with history of [name] on remicade infusion 1000 mg q6wks and rinvoq 15 mg daily. Joint symptoms are mostly doing well she had some days with increased knee pain and stiffness, no visible swelling, warmth, or redness. She notices joint pain is worse in the last 5th or 6th week between remicade infusion. Right axilla with few open sores draining other areas under arms and breasts about the same. She saw dermatology yesterday with recommendation of oral doxycycline and topical cleansing agent for hidradenitis symptoms.   Previous HPI 03/20/21 Angie Stone is a 21 y.o. 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  Symptoms remain well controlled no joint pain swelling or skin disease activity.   Previous HPI 03/06/20 Angie Stone is a 21 y.o. 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 She is looking to transfer care to [facility] from [place] due to proximity and transitioning to adult rheumatology clinic. Currently she feels symptoms are very well controlled but she restarting the infliximab infusions round March due to symptoms increased on tofacitinib monotherapy.    She denies eye inflammation or visual acuity change. She does have recurrent skin lesions she describes as boils over the same area on her upper torso or axillary region. She denies cough, dyspnea, lymphadenopathy, oral ulcers, diarrhea, hematochezia, or alopecia.   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 She has no history of uveitis. She underwent bilateral hip arthroplasty in 2015.   Previous treatments: Tocilizumab, methotrexate ~ 2014 Orencia, Kineret, methotrexate 2014-2015, 2016 Remicade, methotrexate [place] 2016-2020 Sulfasalazine 2018-2019 not tolerated due to rash Leflunomide 2019 ineffective Tofacitinib 2019-current Infliximab resumed 05/2019 due to disease flare     Review of Systems  Constitutional:  Negative  for fatigue.  HENT:  Negative for mouth sores and mouth dryness.   Eyes:  Negative for dryness.  Respiratory:  Negative for shortness of breath.   Cardiovascular:  Negative for chest pain and palpitations.  Gastrointestinal:  Negative for blood in stool, constipation and diarrhea.  Endocrine: Negative for increased urination.  Genitourinary:  Negative for involuntary urination.  Musculoskeletal:  Negative for  joint pain, gait problem, joint pain, joint swelling, myalgias, muscle weakness, morning stiffness, muscle tenderness and myalgias.  Skin:  Negative for color change, rash, hair loss and sensitivity to sunlight.  Allergic/Immunologic: Negative for susceptible to infections.  Neurological:  Negative for dizziness and headaches.  Hematological:  Negative for swollen glands.  Psychiatric/Behavioral:  Negative for depressed mood and sleep disturbance. The patient is not nervous/anxious.     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    Past Medical History:  Diagnosis Date   Immunosuppression (HCC)    [name] (juvenile idiopathic arthritis) (HCC)     Family History  Problem Relation Age of Onset   Diabetes Mother    Diabetes Maternal Grandmother    Arthritis Paternal Grandmother    Past Surgical History:  Procedure Laterality Date   JOINT REPLACEMENT Bilateral    Hips   Social History   Social History Narrative   Not on file   Immunization History  Administered Date(s) Administered   PFIZER(Purple Top)SARS-COV-2 Vaccination 02/18/2020, 03/22/2020     Objective: Vital Signs: BP 123/84 (BP Location: Left Arm, Patient Position: Sitting, Cuff Size: Large)   Pulse (!) 102   Resp 14   Ht 4\' 11"  (1.499 m)   Wt 247 lb 9.6 oz (112.3 kg)   BMI 50.01 kg/m    Physical Exam Constitutional:      Appearance: She is obese.  Cardiovascular:     Rate and Rhythm: Normal rate and regular rhythm.  Pulmonary:     Effort: Pulmonary effort is normal.     Breath sounds: Normal breath sounds.  Musculoskeletal:     Right lower  leg: No edema.     Left lower leg: No edema.  Skin:    General: Skin is warm and dry.  Neurological:     Mental Status: She is alert.  Psychiatric:        Mood and Affect: Mood normal.      Musculoskeletal Exam:  Shoulders full ROM no tenderness or swelling Elbows full ROM no tenderness or swelling Wrists slightly restricted range of motion bilaterally no tenderness or palpable swelling Fingers full ROM no tenderness or swelling Knees full ROM no tenderness or swelling mild patellofemoral crepitus present Ankles full ROM no tenderness or swelling   CDAI Exam: CDAI Score: 3  Patient Global: 20 mm; Provider Global: 10 mm Swollen: 0 ; Tender: 0  Joint Exam 12/11/2021   All documented joints were normal     Investigation: No additional findings.  Imaging: No results found.  Recent Labs: Lab Results  Component Value Date   WBC 8.7 09/03/2021   HGB 13.0 09/03/2021   PLT 311 09/03/2021   NA 135 09/03/2021   K 3.9 09/03/2021   CL 108 09/03/2021  CO2 23 09/03/2021   GLUCOSE 90 09/03/2021   BUN 8 09/03/2021   CREATININE 0.83 09/03/2021   BILITOT 0.7 09/03/2021   ALKPHOS 48 09/03/2021   AST 20 09/03/2021   ALT 20 09/03/2021   PROT 7.1 09/03/2021   ALBUMIN 3.6 09/03/2021   CALCIUM 9.1 09/03/2021   GFRAA 144 09/13/2020   QFTBGOLDPLUS NEGATIVE 12/27/2020    Speciality Comments: Diagnosed in 2010. Combination of tocilizumab IV q2wk and MTX-initiation unclear-d/c 09/2012 Combination Orencia IV q4wk, Kineret sq daily, MTX-7/14-4/15 Tried injectable MTX 30 mg weekly-d/c SE and inefficacy  Remicade infusions 10 mg/kg every 4 weeks Tried SSZ in 2018 and again 2019-d/c both times due to rash [place]-05/16/17-01/06/18-Inadequate response Xeljanz started 01/08/18  Procedures:  No procedures performed Allergies: Sulfasalazine   Assessment / Plan:     Visit Diagnoses: Polyarticular juvenile idiopathic arthritis (HCC) - Plan: Sedimentation rate  Joint inflammation  appears well controlled today.  No major flareups since her last visit.  Checking sedimentation rate.  Plan to continue the Remicade 1000 mg every 6 weeks and Rinvoq 15 mg p.o. daily.  High risk medication use - remicade 1000 mg q6wks and rinvoq 15 mg PO daily - Plan: CBC with Differential/Platelet, COMPLETE METABOLIC PANEL WITH GFR, QuantiFERON-TB Gold Plus  Checking CBC and CMP and repeat QuantiFERON for medication monitoring.  Remains on the low-dose maintenance doxycycline for skin inflammation but no acute infection treatment.  Hidradenitis suppurativa  She is required drainage of left axillary lesion related with the hidradenitis.  Not actively painful or draining at this time.  Orders: Orders Placed This Encounter  Procedures   Sedimentation rate   CBC with Differential/Platelet   COMPLETE METABOLIC PANEL WITH GFR   QuantiFERON-TB Gold Plus   No orders of the defined types were placed in this encounter.    Follow-Up Instructions: Return in about 3 months (around 03/12/2022) for JRA/HS on UPA/REM f/u 66mos.   Fuller Plan, MD  Note - This record has been created using [employer].  Chart creation errors have been sought, but may not always  have been located. Such creation errors do not reflect on  the standard of medical care.

## 2021-12-06 ENCOUNTER — Encounter: Payer: Self-pay | Admitting: Dermatology

## 2021-12-11 ENCOUNTER — Encounter: Payer: Self-pay | Admitting: Internal Medicine

## 2021-12-11 ENCOUNTER — Ambulatory Visit: Payer: Medicaid Other | Attending: Internal Medicine | Admitting: Internal Medicine

## 2021-12-11 VITALS — BP 123/84 | HR 102 | Resp 14 | Ht 59.0 in | Wt 247.6 lb

## 2021-12-11 DIAGNOSIS — Z79899 Other long term (current) drug therapy: Secondary | ICD-10-CM

## 2021-12-11 DIAGNOSIS — M0880 Other juvenile arthritis, unspecified site: Secondary | ICD-10-CM | POA: Diagnosis not present

## 2021-12-11 DIAGNOSIS — L732 Hidradenitis suppurativa: Secondary | ICD-10-CM | POA: Diagnosis not present

## 2021-12-13 LAB — QUANTIFERON-TB GOLD PLUS
Mitogen-NIL: 8.52 IU/mL
NIL: 0.03 IU/mL
QuantiFERON-TB Gold Plus: NEGATIVE
TB1-NIL: 0 IU/mL
TB2-NIL: 0.01 IU/mL

## 2021-12-13 LAB — COMPLETE METABOLIC PANEL WITH GFR
AG Ratio: 1.3 (calc) (ref 1.0–2.5)
ALT: 18 U/L (ref 6–29)
AST: 16 U/L (ref 10–30)
Albumin: 4.3 g/dL (ref 3.6–5.1)
Alkaline phosphatase (APISO): 59 U/L (ref 31–125)
BUN/Creatinine Ratio: 7 (calc) (ref 6–22)
BUN: 6 mg/dL — ABNORMAL LOW (ref 7–25)
CO2: 25 mmol/L (ref 20–32)
Calcium: 9.8 mg/dL (ref 8.6–10.2)
Chloride: 105 mmol/L (ref 98–110)
Creat: 0.81 mg/dL (ref 0.50–0.96)
Globulin: 3.3 g/dL (calc) (ref 1.9–3.7)
Glucose, Bld: 66 mg/dL (ref 65–99)
Potassium: 4.2 mmol/L (ref 3.5–5.3)
Sodium: 139 mmol/L (ref 135–146)
Total Bilirubin: 0.3 mg/dL (ref 0.2–1.2)
Total Protein: 7.6 g/dL (ref 6.1–8.1)
eGFR: 107 mL/min/{1.73_m2} (ref >=60)

## 2021-12-13 LAB — CBC WITH DIFFERENTIAL/PLATELET
Absolute Monocytes: 961 cells/uL — ABNORMAL HIGH (ref 200–950)
Basophils Absolute: 53 cells/uL (ref 0–200)
Basophils Relative: 0.6 %
Eosinophils Absolute: 62 cells/uL (ref 15–500)
Eosinophils Relative: 0.7 %
HCT: 39.5 % (ref 35.0–45.0)
Hemoglobin: 13.4 g/dL (ref 11.7–15.5)
Lymphs Abs: 3489 cells/uL (ref 850–3900)
MCH: 30.7 pg (ref 27.0–33.0)
MCHC: 33.9 g/dL (ref 32.0–36.0)
MCV: 90.4 fL (ref 80.0–100.0)
MPV: 9.4 fL (ref 7.5–12.5)
Monocytes Relative: 10.8 %
Neutro Abs: 4334 cells/uL (ref 1500–7800)
Neutrophils Relative %: 48.7 %
Platelets: 314 10*3/uL (ref 140–400)
RBC: 4.37 10*6/uL (ref 3.80–5.10)
RDW: 13.7 % (ref 11.0–15.0)
Total Lymphocyte: 39.2 %
WBC: 8.9 10*3/uL (ref 3.8–10.8)

## 2021-12-13 LAB — SEDIMENTATION RATE: Sed Rate: 106 mm/h — ABNORMAL HIGH (ref 0–20)

## 2021-12-14 NOTE — Progress Notes (Signed)
Lab result looks fine for continuing remicade and xeljanz. Her sedimentation rate is highly increased to 106 it had gone to normal earlier this year.  This is probably from the hidradenitis skin inflammation I don't think any specific change needed right now. She should let us know if she develops any new systemic problems like swelling or fevers.

## 2021-12-31 ENCOUNTER — Ambulatory Visit (INDEPENDENT_AMBULATORY_CARE_PROVIDER_SITE_OTHER): Payer: Medicaid Other | Admitting: Dermatology

## 2021-12-31 DIAGNOSIS — L701 Acne conglobata: Secondary | ICD-10-CM

## 2021-12-31 DIAGNOSIS — K130 Diseases of lips: Secondary | ICD-10-CM

## 2021-12-31 DIAGNOSIS — L853 Xerosis cutis: Secondary | ICD-10-CM | POA: Diagnosis not present

## 2021-12-31 DIAGNOSIS — Z79899 Other long term (current) drug therapy: Secondary | ICD-10-CM | POA: Diagnosis not present

## 2021-12-31 DIAGNOSIS — L732 Hidradenitis suppurativa: Secondary | ICD-10-CM

## 2021-12-31 NOTE — Progress Notes (Signed)
Isotretinoin   Subjective  Angie Stone is a 21 y.o. female who presents for the following: Follow-up (1 month f/u on Hidradenitis suppurativa patient here to start Isotretinoin)  Side effects: Dry skin, dry lips  Denies changes in night vision, shortness of breath, abdominal pain, nausea, vomiting, diarrhea, blood in stool or urine, visual changes, headaches, epistaxis, joint pain, myalgias, mood changes, depression, or suicidal ideation.   Patient is not pregnant, not seeking pregnancy, and not breastfeeding.  The following portions of the chart were reviewed this encounter and updated as appropriate: medications, allergies, medical history  Review of Systems:  No other skin or systemic complaints except as noted in HPI or Assessment and Plan.  Objective  Well appearing patient in no apparent distress; mood and affect are within normal limits.  An examination of the face, neck, chest, and back was performed and relevant findings are noted below.   b/l axillary and b/l inframammary Scarring and open fistulas of b/l axillas     Assessment & Plan   Hidradenitis suppurativa  And  Acne b/l axillary and b/l inframammary  Chronic and persistent condition with duration or expected duration over one year. Condition is symptomatic / bothersome to patient. Not to goal and flared.   Hidradenitis Suppurativa is a chronic; persistent; non-curable, but treatable condition due to abnormal inflamed sweat glands in the body folds (axilla, inframammary, groin, medial thighs), causing recurrent painful draining cysts and scarring. It can be associated with severe scarring acne and cysts; also abscesses and scarring of scalp. The goal is control and prevention of flares, as it is not curable. Scars are permanent and can be thickened. Treatment may include daily use of topical medication and oral antibiotics.  Oral isotretinoin may also be helpful.  For more severe cases, Humira (a biologic  injection) may be prescribed to decrease the inflammatory process and prevent flares.  When indicated, inflamed cysts may also be treated surgically.   Discussed Isotretinoin will not cure HS but should improve   Reviewed potential side effects of isotretinoin including xerosis, cheilitis, hepatitis, hyperlipidemia, and severe birth defects if taken by a pregnant woman. Reviewed reports of suicidal ideation in those with a history of depression while taking isotretinoin and reports of diagnosis of inflammatory bowl disease while taking isotretinoin as well as the lack of evidence for a causal relationship between isotretinoin, depression and IBD. Patient advised to reach out with any questions or concerns. Patient advised not to share pills or donate blood while on treatment or for one month after completing treatment.     IPLEDGE #[id number] 2 forms of birth control Abstinence  Depo shot Pharmacy [facility] on [address]  Labs reviewed from date 12/11/2021  Labs ordered Lipid panel, HCG Quant pregnancy  Pending normal labs plan on starting Isotretinoin     Related Procedures Lipid Panel HCG, Quant, Pregnancy  Xerosis secondary to isotretinoin therapy - Continue emollients as directed  Cheilitis secondary to isotretinoin therapy - Continue lip balm as directed, Dr. Clayborne Artist Cortibalm recommended  Long term medication management (isotretinoin) - While taking Isotretinoin and for 30 days after you finish the medication, do not get pregnant, do not share pills, do not donate blood. Isotretinoin is best absorbed when taken with a fatty meal. Isotretinoin can make you sensitive to the sun. Daily careful sun protection including sunscreen SPF 30+ when outdoors is recommended.  Follow-up in 30 days.  IAngelique Holm, CMA, am acting as scribe for Armida Sans, MD .  Documentation:  I have reviewed the above documentation for accuracy and completeness, and I agree with the  above.  Sarina Ser, MD

## 2021-12-31 NOTE — Patient Instructions (Signed)
Due to recent changes in healthcare laws, you may see results of your pathology and/or laboratory studies on MyChart before the doctors have had a chance to review them. We understand that in some cases there may be results that are confusing or concerning to you. Please understand that not all results are received at the same time and often the doctors may need to interpret multiple results in order to provide you with the best plan of care or course of treatment. Therefore, we ask that you please give us 2 business days to thoroughly review all your results before contacting the office for clarification. Should we see a critical lab result, you will be contacted sooner.   If You Need Anything After Your Visit  If you have any questions or concerns for your doctor, please call our main line at [phone] and press option 4 to reach your doctor's medical assistant. If no one answers, please leave a voicemail as directed and we will return your call as soon as possible. Messages left after 4 pm will be answered the following business day.   You may also send us a message via MyChart. We typically respond to MyChart messages within 1-2 business days.  For prescription refills, please ask your pharmacy to contact our office. Our fax number is [phone].  If you have an urgent issue when the clinic is closed that cannot wait until the next business day, you can page your doctor at the number below.    Please note that while we do our best to be available for urgent issues outside of office hours, we are not available 24/7.   If you have an urgent issue and are unable to reach us, you may choose to seek medical care at your doctor's office, retail clinic, urgent care center, or emergency room.  If you have a medical emergency, please immediately call 911 or go to the emergency department.  Pager Numbers  - Dr. Kowalski: [phone]  - Dr. Moye: [phone]  - Dr. Stewart:  [phone]  In the event of inclement weather, please call our main line at [phone] for an update on the status of any delays or closures.  Dermatology Medication Tips: Please keep the boxes that topical medications come in in order to help keep track of the instructions about where and how to use these. Pharmacies typically print the medication instructions only on the boxes and not directly on the medication tubes.   If your medication is too expensive, please contact our office at [phone] option 4 or send us a message through MyChart.   We are unable to tell what your co-pay for medications will be in advance as this is different depending on your insurance coverage. However, we may be able to find a substitute medication at lower cost or fill out paperwork to get insurance to cover a needed medication.   If a prior authorization is required to get your medication covered by your insurance company, please allow us 1-2 business days to complete this process.  Drug prices often vary depending on where the prescription is filled and some pharmacies may offer cheaper prices.  The website www.goodrx.com contains coupons for medications through different pharmacies. The prices here do not account for what the cost may be with help from insurance (it may be cheaper with your insurance), but the website can give you the price if you did not use any insurance.  - You can print the associated coupon and take it with   your prescription to the pharmacy.  - You may also stop by our office during regular business hours and pick up a GoodRx coupon card.  - If you need your prescription sent electronically to a different pharmacy, notify our office through [facility] MyChart or by phone at [phone] option 4.     Si Usted Necesita Algo Despus de Su Visita  Tambin puede enviarnos un mensaje a travs de MyChart. Por lo general respondemos a los mensajes de MyChart en el transcurso de 1 a 2  das hbiles.  Para renovar recetas, por favor pida a su farmacia que se ponga en contacto con nuestra oficina. Nuestro nmero de fax es el [phone].  Si tiene un asunto urgente cuando la clnica est cerrada y que no puede esperar hasta el siguiente da hbil, puede llamar/localizar a su doctor(a) al nmero que aparece a continuacin.   Por favor, tenga en cuenta que aunque hacemos todo lo posible para estar disponibles para asuntos urgentes fuera del horario de oficina, no estamos disponibles las 24 horas del da, los 7 das de la semana.   Si tiene un problema urgente y no puede comunicarse con nosotros, puede optar por buscar atencin mdica  en el consultorio de su doctor(a), en una clnica privada, en un centro de atencin urgente o en una sala de emergencias.  Si tiene una emergencia mdica, por favor llame inmediatamente al 911 o vaya a la sala de emergencias.  Nmeros de bper  - Dr. Kowalski: [phone]  - Dra. Moye: [phone]  - Dra. Stewart: [phone]  En caso de inclemencias del tiempo, por favor llame a nuestra lnea principal al [phone] para una actualizacin sobre el estado de cualquier retraso o cierre.  Consejos para la medicacin en dermatologa: Por favor, guarde las cajas en las que vienen los medicamentos de uso tpico para ayudarle a seguir las instrucciones sobre dnde y cmo usarlos. Las farmacias generalmente imprimen las instrucciones del medicamento slo en las cajas y no directamente en los tubos del medicamento.   Si su medicamento es muy caro, por favor, pngase en contacto con nuestra oficina llamando al [phone] y presione la opcin 4 o envenos un mensaje a travs de MyChart.   No podemos decirle cul ser su copago por los medicamentos por adelantado ya que esto es diferente dependiendo de la cobertura de su seguro. Sin embargo, es posible que podamos encontrar un medicamento sustituto a menor costo o llenar un formulario para que el  seguro cubra el medicamento que se considera necesario.   Si se requiere una autorizacin previa para que su compaa de seguros cubra su medicamento, por favor permtanos de 1 a 2 das hbiles para completar este proceso.  Los precios de los medicamentos varan con frecuencia dependiendo del lugar de dnde se surte la receta y alguna farmacias pueden ofrecer precios ms baratos.  El sitio web www.goodrx.com tiene cupones para medicamentos de diferentes farmacias. Los precios aqu no tienen en cuenta lo que podra costar con la ayuda del seguro (puede ser ms barato con su seguro), pero el sitio web puede darle el precio si no utiliz ningn seguro.  - Puede imprimir el cupn correspondiente y llevarlo con su receta a la farmacia.  - Tambin puede pasar por nuestra oficina durante el horario de atencin regular y recoger una tarjeta de cupones de GoodRx.  - Si necesita que su receta se enve electrnicamente a una farmacia diferente, informe a nuestra oficina a travs de MyChart de [facility]   o por telfono llamando al [phone] y presione la opcin 4.  

## 2022-01-01 ENCOUNTER — Encounter: Payer: Self-pay | Admitting: Dermatology

## 2022-01-01 ENCOUNTER — Other Ambulatory Visit: Payer: Self-pay | Admitting: Dermatology

## 2022-01-02 LAB — LIPID PANEL
Chol/HDL Ratio: 4.4 ratio (ref 0.0–4.4)
Cholesterol, Total: 190 mg/dL (ref 100–199)
HDL: 43 mg/dL (ref >39)
LDL Chol Calc (NIH): 136 mg/dL — ABNORMAL HIGH (ref 0–99)
Triglycerides: 61 mg/dL (ref 0–149)
VLDL Cholesterol Cal: 11 mg/dL (ref 5–40)

## 2022-01-02 LAB — BETA HCG QUANT (REF LAB): hCG Quant: <1 m[IU]/mL

## 2022-01-07 ENCOUNTER — Telehealth: Payer: Self-pay

## 2022-01-07 ENCOUNTER — Encounter (HOSPITAL_COMMUNITY): Payer: Medicaid Other

## 2022-01-07 ENCOUNTER — Encounter: Payer: Self-pay | Admitting: Dermatology

## 2022-01-07 DIAGNOSIS — L732 Hidradenitis suppurativa: Secondary | ICD-10-CM

## 2022-01-07 MED ORDER — ISOTRETINOIN 20 MG PO CAPS: ORAL_CAPSULE | ORAL | 0 refills | Status: DC

## 2022-01-07 NOTE — Telephone Encounter (Signed)
Called pt discussed she will have to pick up Isotretinoin today, we will call In Isotretinoin to [facility] in [place].

## 2022-01-07 NOTE — Telephone Encounter (Signed)
Patient informed of lab results and to answer questions on Ipledge. Advised patient that she needs to pick up the prescription today so she doesn't get locked out of Ipledge program. Patient confirmed in [place] and prescription sent to pharmacy.

## 2022-01-07 NOTE — Telephone Encounter (Signed)
-----   Message from Ralene Bathe, MD sent at 01/04/2022  7:19 PM EDT ----- Lab from 01/01/2022 showed: Lipids are OK Pregnancy test negative  Pt may start Isotretinoin 20 mg qd #30 0rf Send to pharmacy.  Continue Birth Control Stop any other medication for acne or Hidradenitis Keep 1 month follow-up appointment

## 2022-01-08 ENCOUNTER — Other Ambulatory Visit: Payer: Self-pay | Admitting: Pharmacist

## 2022-01-08 ENCOUNTER — Ambulatory Visit (HOSPITAL_COMMUNITY)
Admission: RE | Admit: 2022-01-08 | Discharge: 2022-01-08 | Disposition: A | Discharge status: Home / Self Care | Payer: Medicaid Other | Source: Ambulatory Visit | Attending: Internal Medicine | Admitting: Internal Medicine

## 2022-01-08 VITALS — BP 129/91 | HR 86 | Temp 98.4°F | Resp 18

## 2022-01-08 DIAGNOSIS — D84821 Immunodeficiency due to drugs: Secondary | ICD-10-CM | POA: Diagnosis present

## 2022-01-08 DIAGNOSIS — E559 Vitamin D deficiency, unspecified: Secondary | ICD-10-CM

## 2022-01-08 DIAGNOSIS — Z111 Encounter for screening for respiratory tuberculosis: Secondary | ICD-10-CM

## 2022-01-08 DIAGNOSIS — M0880 Other juvenile arthritis, unspecified site: Secondary | ICD-10-CM | POA: Diagnosis present

## 2022-01-08 DIAGNOSIS — L732 Hidradenitis suppurativa: Secondary | ICD-10-CM

## 2022-01-08 DIAGNOSIS — Z79899 Other long term (current) drug therapy: Secondary | ICD-10-CM

## 2022-01-08 DIAGNOSIS — Z96643 Presence of artificial hip joint, bilateral: Secondary | ICD-10-CM

## 2022-01-08 LAB — COMPREHENSIVE METABOLIC PANEL
ALT: 16 U/L (ref 0–44)
AST: 21 U/L (ref 15–41)
Albumin: 3.5 g/dL (ref 3.5–5.0)
Alkaline Phosphatase: 52 U/L (ref 38–126)
Anion gap: 5 (ref 5–15)
BUN: 6 mg/dL (ref 6–20)
CO2: 23 mmol/L (ref 22–32)
Calcium: 9.1 mg/dL (ref 8.9–10.3)
Chloride: 109 mmol/L (ref 98–111)
Creatinine, Ser: 0.78 mg/dL (ref 0.44–1.00)
GFR, Estimated: >60 mL/min (ref >60)
Glucose, Bld: 84 mg/dL (ref 70–99)
Potassium: 4.1 mmol/L (ref 3.5–5.1)
Sodium: 137 mmol/L (ref 135–145)
Total Bilirubin: 0.7 mg/dL (ref 0.3–1.2)
Total Protein: 7.2 g/dL (ref 6.5–8.1)

## 2022-01-08 LAB — CBC WITH DIFFERENTIAL/PLATELET
Abs Immature Granulocytes: 0.08 10*3/uL — ABNORMAL HIGH (ref 0.00–0.07)
Basophils Absolute: 0.1 10*3/uL (ref 0.0–0.1)
Basophils Relative: 1 %
Eosinophils Absolute: 0.1 10*3/uL (ref 0.0–0.5)
Eosinophils Relative: 1 %
HCT: 39.2 % (ref 36.0–46.0)
Hemoglobin: 12.8 g/dL (ref 12.0–15.0)
Immature Granulocytes: 1 %
Lymphocytes Relative: 41 %
Lymphs Abs: 3.4 10*3/uL (ref 0.7–4.0)
MCH: 29.9 pg (ref 26.0–34.0)
MCHC: 32.7 g/dL (ref 30.0–36.0)
MCV: 91.6 fL (ref 80.0–100.0)
Monocytes Absolute: 0.9 10*3/uL (ref 0.1–1.0)
Monocytes Relative: 11 %
Neutro Abs: 3.8 10*3/uL (ref 1.7–7.7)
Neutrophils Relative %: 45 %
Platelets: 369 10*3/uL (ref 150–400)
RBC: 4.28 MIL/uL (ref 3.87–5.11)
RDW: 13.7 % (ref 11.5–15.5)
WBC: 8.2 10*3/uL (ref 4.0–10.5)
nRBC: 0 % (ref 0.0–0.2)

## 2022-01-08 MED ORDER — ACETAMINOPHEN 325 MG PO TABS
ORAL_TABLET | ORAL | Status: AC
  Filled 2022-01-08: qty 2

## 2022-01-08 MED ORDER — METHYLPREDNISOLONE SODIUM SUCC 40 MG IJ SOLR
40.0000 mg | INTRAMUSCULAR | Status: DC
  Administered 2022-01-08: 40 mg via INTRAVENOUS

## 2022-01-08 MED ORDER — METHYLPREDNISOLONE SODIUM SUCC 40 MG IJ SOLR
INTRAMUSCULAR | Status: AC
  Filled 2022-01-08: qty 1

## 2022-01-08 MED ORDER — ACETAMINOPHEN 325 MG PO TABS
650.0000 mg | ORAL_TABLET | ORAL | Status: DC
  Administered 2022-01-08: 650 mg via ORAL

## 2022-01-08 MED ORDER — SODIUM CHLORIDE 0.9 % IV SOLN
1000.0000 mg | INTRAVENOUS | Status: DC
  Administered 2022-01-08: 1000 mg via INTRAVENOUS
  Filled 2022-01-08: qty 100

## 2022-01-08 MED ORDER — DIPHENHYDRAMINE HCL 25 MG PO CAPS
25.0000 mg | ORAL_CAPSULE | ORAL | Status: DC
  Administered 2022-01-08: 25 mg via ORAL

## 2022-01-08 MED ORDER — DIPHENHYDRAMINE HCL 25 MG PO CAPS
ORAL_CAPSULE | ORAL | Status: AC
  Filled 2022-01-08: qty 1

## 2022-01-08 NOTE — Progress Notes (Signed)
Next infusion scheduled for Remicade on 02/19/22 and due for updated orders. Diagnosis: PJIA  Dose: 1000mg  IV every 6 weeks  Last Clinic Visit: 12/11/21 Next Clinic Visit: 03/14/22  Last infusion: 01/08/22  Labs: CBC and CMP on 01/08/22 - wnl TB Gold: negative on 01/08/22   Orders placed for Remicade x 2 doses along with premedication of acetaminophen and diphenhydramine and solumedrol 40mg  to be administered 30 minutes before medication infusion.  Standing CBC with diff/platelet and CMP with GFR orders placed to be drawn with every other infusion.  Next TB gold due 01/09/23  Knox Saliva, PharmD, MPH, BCPS, CPP Clinical Pharmacist (Rheumatology and Pulmonology)

## 2022-01-10 LAB — QUANTIFERON-TB GOLD PLUS (RQFGPL)
QuantiFERON Mitogen Value: 8.38 IU/mL
QuantiFERON Nil Value: 0.06 IU/mL
QuantiFERON TB1 Ag Value: 0.07 IU/mL
QuantiFERON TB2 Ag Value: 0.07 IU/mL

## 2022-01-10 LAB — QUANTIFERON-TB GOLD PLUS: QuantiFERON-TB Gold Plus: NEGATIVE

## 2022-01-23 ENCOUNTER — Other Ambulatory Visit: Payer: Self-pay | Admitting: Internal Medicine

## 2022-01-23 DIAGNOSIS — M0880 Other juvenile arthritis, unspecified site: Secondary | ICD-10-CM

## 2022-01-23 NOTE — Telephone Encounter (Signed)
Next Visit: 03/14/2022  Last Visit: 12/11/2021  Last Fill: 10/23/2021  DX: Polyarticular juvenile idiopathic arthritis   Current Dose per office note 12/11/2021: Rinvoq 15 mg p.o. daily  Labs: 01/08/2022 CMP WNL CBC Abs Immature Granulocytes 0.08  TB Gold: 01/08/2022 Neg   Okay to refill Rinvoq?

## 2022-01-30 ENCOUNTER — Ambulatory Visit (INDEPENDENT_AMBULATORY_CARE_PROVIDER_SITE_OTHER): Payer: Medicaid Other | Admitting: Dermatology

## 2022-01-30 DIAGNOSIS — L732 Hidradenitis suppurativa: Secondary | ICD-10-CM

## 2022-01-30 DIAGNOSIS — Z7189 Other specified counseling: Secondary | ICD-10-CM | POA: Diagnosis not present

## 2022-01-30 DIAGNOSIS — L701 Acne conglobata: Secondary | ICD-10-CM | POA: Diagnosis not present

## 2022-01-30 DIAGNOSIS — Z79899 Other long term (current) drug therapy: Secondary | ICD-10-CM | POA: Diagnosis not present

## 2022-01-30 DIAGNOSIS — L853 Xerosis cutis: Secondary | ICD-10-CM

## 2022-01-30 DIAGNOSIS — K130 Diseases of lips: Secondary | ICD-10-CM

## 2022-01-30 MED ORDER — ISOTRETINOIN 30 MG PO CAPS: 30.0000 mg | ORAL_CAPSULE | Freq: Every day | ORAL | 0 refills | Status: DC

## 2022-01-30 NOTE — Patient Instructions (Signed)
Due to recent changes in healthcare laws, you may see results of your pathology and/or laboratory studies on MyChart before the doctors have had a chance to review them. We understand that in some cases there may be results that are confusing or concerning to you. Please understand that not all results are received at the same time and often the doctors may need to interpret multiple results in order to provide you with the best plan of care or course of treatment. Therefore, we ask that you please give us 2 business days to thoroughly review all your results before contacting the office for clarification. Should we see a critical lab result, you will be contacted sooner.   If You Need Anything After Your Visit  If you have any questions or concerns for your doctor, please call our main line at [phone] and press option 4 to reach your doctor's medical assistant. If no one answers, please leave a voicemail as directed and we will return your call as soon as possible. Messages left after 4 pm will be answered the following business day.   You may also send us a message via MyChart. We typically respond to MyChart messages within 1-2 business days.  For prescription refills, please ask your pharmacy to contact our office. Our fax number is [phone].  If you have an urgent issue when the clinic is closed that cannot wait until the next business day, you can page your doctor at the number below.    Please note that while we do our best to be available for urgent issues outside of office hours, we are not available 24/7.   If you have an urgent issue and are unable to reach us, you may choose to seek medical care at your doctor's office, retail clinic, urgent care center, or emergency room.  If you have a medical emergency, please immediately call 911 or go to the emergency department.  Pager Numbers  - Dr. Kowalski: [phone]  - Dr. Moye: [phone]  - Dr. Stewart:  [phone]  In the event of inclement weather, please call our main line at [phone] for an update on the status of any delays or closures.  Dermatology Medication Tips: Please keep the boxes that topical medications come in in order to help keep track of the instructions about where and how to use these. Pharmacies typically print the medication instructions only on the boxes and not directly on the medication tubes.   If your medication is too expensive, please contact our office at [phone] option 4 or send us a message through MyChart.   We are unable to tell what your co-pay for medications will be in advance as this is different depending on your insurance coverage. However, we may be able to find a substitute medication at lower cost or fill out paperwork to get insurance to cover a needed medication.   If a prior authorization is required to get your medication covered by your insurance company, please allow us 1-2 business days to complete this process.  Drug prices often vary depending on where the prescription is filled and some pharmacies may offer cheaper prices.  The website www.goodrx.com contains coupons for medications through different pharmacies. The prices here do not account for what the cost may be with help from insurance (it may be cheaper with your insurance), but the website can give you the price if you did not use any insurance.  - You can print the associated coupon and take it with   your prescription to the pharmacy.  - You may also stop by our office during regular business hours and pick up a GoodRx coupon card.  - If you need your prescription sent electronically to a different pharmacy, notify our office through [facility] MyChart or by phone at [phone] option 4.     Si Usted Necesita Algo Despus de Su Visita  Tambin puede enviarnos un mensaje a travs de MyChart. Por lo general respondemos a los mensajes de MyChart en el transcurso de 1 a 2  das hbiles.  Para renovar recetas, por favor pida a su farmacia que se ponga en contacto con nuestra oficina. Nuestro nmero de fax es el [phone].  Si tiene un asunto urgente cuando la clnica est cerrada y que no puede esperar hasta el siguiente da hbil, puede llamar/localizar a su doctor(a) al nmero que aparece a continuacin.   Por favor, tenga en cuenta que aunque hacemos todo lo posible para estar disponibles para asuntos urgentes fuera del horario de oficina, no estamos disponibles las 24 horas del da, los 7 das de la semana.   Si tiene un problema urgente y no puede comunicarse con nosotros, puede optar por buscar atencin mdica  en el consultorio de su doctor(a), en una clnica privada, en un centro de atencin urgente o en una sala de emergencias.  Si tiene una emergencia mdica, por favor llame inmediatamente al 911 o vaya a la sala de emergencias.  Nmeros de bper  - Dr. Kowalski: [phone]  - Dra. Moye: [phone]  - Dra. Stewart: [phone]  En caso de inclemencias del tiempo, por favor llame a nuestra lnea principal al [phone] para una actualizacin sobre el estado de cualquier retraso o cierre.  Consejos para la medicacin en dermatologa: Por favor, guarde las cajas en las que vienen los medicamentos de uso tpico para ayudarle a seguir las instrucciones sobre dnde y cmo usarlos. Las farmacias generalmente imprimen las instrucciones del medicamento slo en las cajas y no directamente en los tubos del medicamento.   Si su medicamento es muy caro, por favor, pngase en contacto con nuestra oficina llamando al [phone] y presione la opcin 4 o envenos un mensaje a travs de MyChart.   No podemos decirle cul ser su copago por los medicamentos por adelantado ya que esto es diferente dependiendo de la cobertura de su seguro. Sin embargo, es posible que podamos encontrar un medicamento sustituto a menor costo o llenar un formulario para que el  seguro cubra el medicamento que se considera necesario.   Si se requiere una autorizacin previa para que su compaa de seguros cubra su medicamento, por favor permtanos de 1 a 2 das hbiles para completar este proceso.  Los precios de los medicamentos varan con frecuencia dependiendo del lugar de dnde se surte la receta y alguna farmacias pueden ofrecer precios ms baratos.  El sitio web www.goodrx.com tiene cupones para medicamentos de diferentes farmacias. Los precios aqu no tienen en cuenta lo que podra costar con la ayuda del seguro (puede ser ms barato con su seguro), pero el sitio web puede darle el precio si no utiliz ningn seguro.  - Puede imprimir el cupn correspondiente y llevarlo con su receta a la farmacia.  - Tambin puede pasar por nuestra oficina durante el horario de atencin regular y recoger una tarjeta de cupones de GoodRx.  - Si necesita que su receta se enve electrnicamente a una farmacia diferente, informe a nuestra oficina a travs de MyChart de [facility]   o por telfono llamando al [phone] y presione la opcin 4.  

## 2022-01-30 NOTE — Progress Notes (Signed)
Isotretinoin Follow-Up Visit   Subjective  Angie Stone is a 21 y.o. female who presents for the following: Hidradenitis (Bil axilla, inframammary, Isotretinoin 32m f/u, Isotretinoin 20mg  1 po qd).  Week # 4   Isotretinoin F/U - 01/30/22 1500       Isotretinoin Follow Up   Date 01/30/22    Two Forms of Birth Control Abstinence;Injection      Side Effects   Skin Dry Eyes;Chapped Lips;Dry Lips    Gastrointestinal WNL    Neurological Headache    Constitutional WNL            Side effects: Dry skin, dry lips  Denies changes in night vision, shortness of breath, abdominal pain, nausea, vomiting, diarrhea, blood in stool or urine, visual changes, headaches, epistaxis, joint pain, myalgias, mood changes, depression, or suicidal ideation.   Patient is not pregnant, not seeking pregnancy, and not breastfeeding.   The following portions of the chart were reviewed this encounter and updated as appropriate: medications, allergies, medical history  Review of Systems:  No other skin or systemic complaints except as noted in HPI or Assessment and Plan.  Objective  Well appearing patient in no apparent distress; mood and affect are within normal limits.  An examination of the face, neck, chest, and back was performed and relevant findings are noted below.   bil axilla, inframammary Scarring and open fistulas bil axilla   Assessment & Plan   Hidradenitis suppurativa bil axilla, inframammary  Hidradenitis Suppurativa is a chronic; persistent; non-curable, but treatable condition due to abnormal inflamed sweat glands in the body folds (axilla, inframammary, groin, medial thighs), causing recurrent painful draining cysts and scarring. It can be associated with severe scarring acne and cysts; also abscesses and scarring of scalp. The goal is control and prevention of flares, as it is not curable. Scars are permanent and can be thickened. Treatment may include daily use of topical  medication and oral antibiotics.  Oral isotretinoin may also be helpful.  For more severe cases, Humira (a biologic injection) may be prescribed to decrease the inflammatory process and prevent flares.  When indicated, inflamed cysts may also be treated surgically.  Severe; On Isotretinoin -  requiring FDA mandated monthly evaluations and laboratory monitoring; Chronic and Persistent; Not to Goal   Wk 4 Isotretinoin IPLEDGE #[id number] 2 forms of birth control Abstinence  Depo shot [facility] on [address]  Urine pregnancy test performed in office today and was negative.  Patient demonstrates comprehension and confirms she will not get pregnant.   Lot [id number] exp 05/22/2023  Increase to Isotretinoin 30mg  1 po qd  Pt confirmed in [facility] program  Isotretinoin Counseling; Review and Contraception Counseling: Reviewed potential side effects of isotretinoin including xerosis, cheilitis, hepatitis, hyperlipidemia, and severe birth defects if taken by a pregnant woman.  Women on isotretinoin must be celibate (not having sex) or required to use at least 2 birth control methods to prevent pregnancy (unless patient is a female of non-child bearing potential).  Females of child-bearing potential must have monthly pregnancy tests while on isotretinoin and report through I-Pledge (FDA monitoring program). Reviewed reports of suicidal ideation in those with a history of depression while taking isotretinoin and reports of diagnosis of inflammatory bowl disease (IBD) while taking isotretinoin as well as the lack of evidence for a causal relationship between isotretinoin, depression and IBD. Patient advised to reach out with any questions or concerns. Patient advised not to share pills or donate blood while on treatment or  for one month after completing treatment. All patient's considering Isotretinoin must read and understand and sign Isotretinoin Consent Form and be registered with  I-Pledge.  ISOtretinoin (ACCUTANE) 30 MG capsule - bil axilla, inframammary Take 1 capsule (30 mg total) by mouth daily. Take with fatty meal  Xerosis secondary to isotretinoin therapy - Continue emollients as directed - Xyzal (levocetirizine) once a day and fish oil 1 gram daily may also help with dryness  Cheilitis secondary to isotretinoin therapy - Continue lip balm as directed, Dr. Clayborne Artist Cortibalm recommended  Long term medication management (isotretinoin) - While taking Isotretinoin and for 30 days after you finish the medication, do not get pregnant, do not share pills, do not donate blood. Isotretinoin is best absorbed when taken with a fatty meal. Isotretinoin can make you sensitive to the sun. Daily careful sun protection including sunscreen SPF 30+ when outdoors is recommended.  Follow-up in 5 weeks.  I, Ardis Rowan, RMA, am acting as scribe for Armida Sans, MD . Documentation: I have reviewed the above documentation for accuracy and completeness, and I agree with the above.  Armida Sans, MD

## 2022-01-31 ENCOUNTER — Ambulatory Visit: Payer: Medicaid Other | Admitting: Dermatology

## 2022-02-14 ENCOUNTER — Encounter: Payer: Self-pay | Admitting: Dermatology

## 2022-02-14 ENCOUNTER — Ambulatory Visit (INDEPENDENT_AMBULATORY_CARE_PROVIDER_SITE_OTHER): Payer: Medicaid Other | Admitting: Dermatology

## 2022-02-14 DIAGNOSIS — L732 Hidradenitis suppurativa: Secondary | ICD-10-CM

## 2022-02-14 DIAGNOSIS — Z79899 Other long term (current) drug therapy: Secondary | ICD-10-CM | POA: Diagnosis not present

## 2022-02-14 DIAGNOSIS — L03313 Cellulitis of chest wall: Secondary | ICD-10-CM

## 2022-02-14 MED ORDER — CEPHALEXIN 500 MG PO CAPS: ORAL_CAPSULE | ORAL | 0 refills | Status: DC

## 2022-02-14 NOTE — Progress Notes (Signed)
   Follow-Up Visit   Subjective  Merida Alcantar is a 21 y.o. female who presents for the following: Follow-up (Patient c/o HS flare that recently flared under her left arm and beneath her breast,  pt taking Isotretinoin therapy ~week 6 at 30 mg daily ).  Mother with patient  The following portions of the chart were reviewed this encounter and updated as appropriate:   Tobacco  Allergies  Meds  Problems  Med Hx  Surg Hx  Fam Hx     Review of Systems:  No other skin or systemic complaints except as noted in HPI or Assessment and Plan.  Objective  Well appearing patient in no apparent distress; mood and affect are within normal limits.  A focused examination was performed including right axilla, left axilla. Relevant physical exam findings are noted in the Assessment and Plan.  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 Condition is symptomatic / bothersome to patient. Not to goal.  Flared with cellulitis  Hidradenitis Suppurativa is a chronic; persistent; non-curable, but treatable condition due to abnormal inflamed sweat glands in the body folds (axilla, inframammary, groin, medial thighs), causing recurrent painful draining cysts and scarring. It can be associated with severe scarring acne and cysts; also abscesses and scarring of scalp. The goal is control and prevention of flares, as it is not curable. Scars are permanent and can be thickened. Treatment may include daily use of topical medication and oral antibiotics.  Oral isotretinoin may also be helpful.  For more severe cases, Humira (a biologic injection) may be prescribed to decrease the inflammatory process and prevent flares.  When indicated, inflamed cysts may also be treated surgically.     May consider Humira injection in the future or there may be other options for HS in the future   Bacterial C&S pending  Cleansed skin with warm water applied Mupirocin ointment and a non stick guage  Start Cephalexin 500 mg take 1 tablet po bid x 1 week  Continue Isotretinoin 30 mg once a day   Related Procedures Anaerobic and Aerobic Culture  Related Medications ISOtretinoin (ACCUTANE) 30 MG capsule Take 1 capsule (30 mg total) by mouth daily. Take with fatty meal   Return for as scheduled .  IAngelique Holm, CMA, am acting as scribe for Armida Sans, MD .  Documentation: I have reviewed the above documentation for accuracy and completeness, and I agree with the above.  Armida Sans, MD

## 2022-02-14 NOTE — Patient Instructions (Signed)
Due to recent changes in healthcare laws, you may see results of your pathology and/or laboratory studies on MyChart before the doctors have had a chance to review them. We understand that in some cases there may be results that are confusing or concerning to you. Please understand that not all results are received at the same time and often the doctors may need to interpret multiple results in order to provide you with the best plan of care or course of treatment. Therefore, we ask that you please give us 2 business days to thoroughly review all your results before contacting the office for clarification. Should we see a critical lab result, you will be contacted sooner.   If You Need Anything After Your Visit  If you have any questions or concerns for your doctor, please call our main line at [phone] and press option 4 to reach your doctor's medical assistant. If no one answers, please leave a voicemail as directed and we will return your call as soon as possible. Messages left after 4 pm will be answered the following business day.   You may also send us a message via MyChart. We typically respond to MyChart messages within 1-2 business days.  For prescription refills, please ask your pharmacy to contact our office. Our fax number is [phone].  If you have an urgent issue when the clinic is closed that cannot wait until the next business day, you can page your doctor at the number below.    Please note that while we do our best to be available for urgent issues outside of office hours, we are not available 24/7.   If you have an urgent issue and are unable to reach us, you may choose to seek medical care at your doctor's office, retail clinic, urgent care center, or emergency room.  If you have a medical emergency, please immediately call 911 or go to the emergency department.  Pager Numbers  - Dr. Kowalski: [phone]  - Dr. Moye: [phone]  - Dr. Stewart:  [phone]  In the event of inclement weather, please call our main line at [phone] for an update on the status of any delays or closures.  Dermatology Medication Tips: Please keep the boxes that topical medications come in in order to help keep track of the instructions about where and how to use these. Pharmacies typically print the medication instructions only on the boxes and not directly on the medication tubes.   If your medication is too expensive, please contact our office at [phone] option 4 or send us a message through MyChart.   We are unable to tell what your co-pay for medications will be in advance as this is different depending on your insurance coverage. However, we may be able to find a substitute medication at lower cost or fill out paperwork to get insurance to cover a needed medication.   If a prior authorization is required to get your medication covered by your insurance company, please allow us 1-2 business days to complete this process.  Drug prices often vary depending on where the prescription is filled and some pharmacies may offer cheaper prices.  The website www.goodrx.com contains coupons for medications through different pharmacies. The prices here do not account for what the cost may be with help from insurance (it may be cheaper with your insurance), but the website can give you the price if you did not use any insurance.  - You can print the associated coupon and take it with   your prescription to the pharmacy.  - You may also stop by our office during regular business hours and pick up a GoodRx coupon card.  - If you need your prescription sent electronically to a different pharmacy, notify our office through [facility] MyChart or by phone at [phone] option 4.     Si Usted Necesita Algo Despus de Su Visita  Tambin puede enviarnos un mensaje a travs de MyChart. Por lo general respondemos a los mensajes de MyChart en el transcurso de 1 a 2  das hbiles.  Para renovar recetas, por favor pida a su farmacia que se ponga en contacto con nuestra oficina. Nuestro nmero de fax es el [phone].  Si tiene un asunto urgente cuando la clnica est cerrada y que no puede esperar hasta el siguiente da hbil, puede llamar/localizar a su doctor(a) al nmero que aparece a continuacin.   Por favor, tenga en cuenta que aunque hacemos todo lo posible para estar disponibles para asuntos urgentes fuera del horario de oficina, no estamos disponibles las 24 horas del da, los 7 das de la semana.   Si tiene un problema urgente y no puede comunicarse con nosotros, puede optar por buscar atencin mdica  en el consultorio de su doctor(a), en una clnica privada, en un centro de atencin urgente o en una sala de emergencias.  Si tiene una emergencia mdica, por favor llame inmediatamente al 911 o vaya a la sala de emergencias.  Nmeros de bper  - Dr. Kowalski: [phone]  - Dra. Moye: [phone]  - Dra. Stewart: [phone]  En caso de inclemencias del tiempo, por favor llame a nuestra lnea principal al [phone] para una actualizacin sobre el estado de cualquier retraso o cierre.  Consejos para la medicacin en dermatologa: Por favor, guarde las cajas en las que vienen los medicamentos de uso tpico para ayudarle a seguir las instrucciones sobre dnde y cmo usarlos. Las farmacias generalmente imprimen las instrucciones del medicamento slo en las cajas y no directamente en los tubos del medicamento.   Si su medicamento es muy caro, por favor, pngase en contacto con nuestra oficina llamando al [phone] y presione la opcin 4 o envenos un mensaje a travs de MyChart.   No podemos decirle cul ser su copago por los medicamentos por adelantado ya que esto es diferente dependiendo de la cobertura de su seguro. Sin embargo, es posible que podamos encontrar un medicamento sustituto a menor costo o llenar un formulario para que el  seguro cubra el medicamento que se considera necesario.   Si se requiere una autorizacin previa para que su compaa de seguros cubra su medicamento, por favor permtanos de 1 a 2 das hbiles para completar este proceso.  Los precios de los medicamentos varan con frecuencia dependiendo del lugar de dnde se surte la receta y alguna farmacias pueden ofrecer precios ms baratos.  El sitio web www.goodrx.com tiene cupones para medicamentos de diferentes farmacias. Los precios aqu no tienen en cuenta lo que podra costar con la ayuda del seguro (puede ser ms barato con su seguro), pero el sitio web puede darle el precio si no utiliz ningn seguro.  - Puede imprimir el cupn correspondiente y llevarlo con su receta a la farmacia.  - Tambin puede pasar por nuestra oficina durante el horario de atencin regular y recoger una tarjeta de cupones de GoodRx.  - Si necesita que su receta se enve electrnicamente a una farmacia diferente, informe a nuestra oficina a travs de MyChart de [facility]   o por telfono llamando al [phone] y presione la opcin 4.  

## 2022-02-15 ENCOUNTER — Encounter: Payer: Self-pay | Admitting: Dermatology

## 2022-02-19 ENCOUNTER — Telehealth: Payer: Self-pay | Admitting: *Deleted

## 2022-02-19 ENCOUNTER — Ambulatory Visit (HOSPITAL_COMMUNITY)
Admission: RE | Admit: 2022-02-19 | Discharge: 2022-02-19 | Disposition: A | Discharge status: Home / Self Care | Payer: Medicaid Other | Source: Ambulatory Visit | Attending: Internal Medicine | Admitting: Internal Medicine

## 2022-02-19 NOTE — Progress Notes (Signed)
Patient instructed per office to reschedule remicade once infection cleared.

## 2022-02-19 NOTE — Telephone Encounter (Signed)
I agree, thanks!

## 2022-02-19 NOTE — Telephone Encounter (Signed)
Received a call from [place] with the infusion center. Patient is currently on Keflex for an infection under her breast. Patient was due for her Remicade infusion. Advised Boneta Lucks patient should hold her infusion until she has completed the antibiotics and her infections has cleared.

## 2022-02-20 LAB — ANAEROBIC AND AEROBIC CULTURE

## 2022-02-22 ENCOUNTER — Encounter: Payer: Self-pay | Admitting: Internal Medicine

## 2022-02-26 ENCOUNTER — Telehealth: Payer: Self-pay

## 2022-02-26 NOTE — Telephone Encounter (Signed)
Patient informed of culture results and states that wound is no longer draining or tender.

## 2022-02-26 NOTE — Telephone Encounter (Signed)
-----   Message from Deirdre Evener, MD sent at 02/26/2022 10:25 AM EST ----- Culture from draining Hidradenitis wound showed: Scant growth Proteus mirabilis Sensitive to all antibiotics tested EXCEPT Tetracycline = Resistant. Pt currently on Cephalexin  Call and advise pt and see how she is doing. Keep follow up appt - pt also on Isotretinoin.

## 2022-02-28 ENCOUNTER — Encounter (HOSPITAL_COMMUNITY)
Admission: RE | Admit: 2022-02-28 | Discharge: 2022-02-28 | Disposition: A | Discharge status: Home / Self Care | Payer: Medicaid Other | Source: Ambulatory Visit | Attending: Internal Medicine | Admitting: Internal Medicine

## 2022-02-28 DIAGNOSIS — L732 Hidradenitis suppurativa: Secondary | ICD-10-CM | POA: Insufficient documentation

## 2022-02-28 DIAGNOSIS — M0880 Other juvenile arthritis, unspecified site: Secondary | ICD-10-CM | POA: Insufficient documentation

## 2022-02-28 DIAGNOSIS — Z79899 Other long term (current) drug therapy: Secondary | ICD-10-CM | POA: Insufficient documentation

## 2022-02-28 MED ORDER — DIPHENHYDRAMINE HCL 25 MG PO CAPS
ORAL_CAPSULE | ORAL | Status: AC
  Filled 2022-02-28: qty 1

## 2022-02-28 MED ORDER — METHYLPREDNISOLONE SODIUM SUCC 40 MG IJ SOLR
INTRAMUSCULAR | Status: AC
  Filled 2022-02-28: qty 1

## 2022-02-28 MED ORDER — ACETAMINOPHEN 325 MG PO TABS
650.0000 mg | ORAL_TABLET | ORAL | Status: DC
  Administered 2022-02-28: 650 mg via ORAL

## 2022-02-28 MED ORDER — DIPHENHYDRAMINE HCL 25 MG PO CAPS
25.0000 mg | ORAL_CAPSULE | ORAL | Status: DC
  Administered 2022-02-28: 25 mg via ORAL

## 2022-02-28 MED ORDER — SODIUM CHLORIDE 0.9 % IV SOLN
1000.0000 mg | INTRAVENOUS | Status: DC
  Administered 2022-02-28: 1000 mg via INTRAVENOUS
  Filled 2022-02-28: qty 100

## 2022-02-28 MED ORDER — ACETAMINOPHEN 325 MG PO TABS
ORAL_TABLET | ORAL | Status: AC
  Filled 2022-02-28: qty 2

## 2022-02-28 MED ORDER — METHYLPREDNISOLONE SODIUM SUCC 40 MG IJ SOLR
40.0000 mg | INTRAMUSCULAR | Status: DC
  Administered 2022-02-28: 40 mg via INTRAVENOUS

## 2022-03-05 ENCOUNTER — Encounter: Payer: Self-pay | Admitting: Dermatology

## 2022-03-06 ENCOUNTER — Ambulatory Visit (INDEPENDENT_AMBULATORY_CARE_PROVIDER_SITE_OTHER): Payer: Medicaid Other | Admitting: Dermatology

## 2022-03-06 VITALS — BP 118/81 | HR 87 | Wt 240.0 lb

## 2022-03-06 DIAGNOSIS — L853 Xerosis cutis: Secondary | ICD-10-CM

## 2022-03-06 DIAGNOSIS — Z79899 Other long term (current) drug therapy: Secondary | ICD-10-CM

## 2022-03-06 DIAGNOSIS — L732 Hidradenitis suppurativa: Secondary | ICD-10-CM

## 2022-03-06 DIAGNOSIS — K130 Diseases of lips: Secondary | ICD-10-CM

## 2022-03-06 MED ORDER — ISOTRETINOIN 40 MG PO CAPS: 40.0000 mg | ORAL_CAPSULE | Freq: Every day | ORAL | 0 refills | Status: AC

## 2022-03-06 NOTE — Progress Notes (Signed)
Isotretinoin Follow-Up Visit   Subjective  Angie Stone is a 21 y.o. female who presents for the following: Follow-up (Hidradenitis (Bil axilla, inframammary, Isotretinoin 83m f/u, Isotretinoin 30mg  1 po qd). ).  Week # 8   Isotretinoin F/U - 03/06/22 1100       Isotretinoin Follow Up   Weight 240 lb (108.9 kg)    Two Forms of Birth Control Abstinence;Injection      Dosage   Target Dosage (mg) 22400    Current (To Date) Dosage (mg) 1500    To Go Dosage (mg) 20900      Side Effects   Skin Dry Lips;Dry Skin    Gastrointestinal WNL    Neurological WNL    Constitutional WNL             Side effects: Dry skin, dry lips  Denies changes in night vision, shortness of breath, abdominal pain, nausea, vomiting, diarrhea, blood in stool or urine, visual changes, headaches, epistaxis, joint pain, myalgias, mood changes, depression, or suicidal ideation.   Patient is not pregnant, not seeking pregnancy, and not breastfeeding.  The following portions of the chart were reviewed this encounter and updated as appropriate: medications, allergies, medical history  Review of Systems:  No other skin or systemic complaints except as noted in HPI or Assessment and Plan.  Objective  Well appearing patient in no apparent distress; mood and affect are within normal limits.  An examination of the face, neck, chest, and back was performed and relevant findings are noted below.   bilateral axillae, beneath breast Scarring and open fistulas bil axillae     Assessment & Plan   Hidradenitis suppurativa bilateral axillae, beneath breast  Chronic and persistent condition with duration or expected duration over one year. Condition is symptomatic / bothersome to patient. Not to goal.  Flared with cellulitis   Hidradenitis Suppurativa is a chronic; persistent; non-curable, but treatable condition due to abnormal inflamed sweat glands in the body folds (axilla, inframammary, groin, medial  thighs), causing recurrent painful draining cysts and scarring. It can be associated with severe scarring acne and cysts; also abscesses and scarring of scalp. The goal is control and prevention of flares, as it is not curable. Scars are permanent and can be thickened. Treatment may include daily use of topical medication and oral antibiotics.  Oral isotretinoin may also be helpful.  For more severe cases, Humira (a biologic injection) may be prescribed to decrease the inflammatory process and prevent flares.  When indicated, inflamed cysts may also be treated surgically.   Severe; On Isotretinoin -  requiring FDA mandated monthly evaluations and laboratory monitoring; Chronic and Persistent; Not to Goal    Wk 8 Isotretinoin   Total mg = 1,500 Total mg/kg = 14  IPLEDGE 05-04-1968 2 forms of birth control Abstinence  Depo shot Pharmacy [facility] [place] [address]   Urine pregnancy test performed in office today and was negative.  Patient demonstrates comprehension and confirms she will not get pregnant.   Lot Th[place] exp 05/22/2023  Increase to Isotretinoin 40 mg take 1 tablet once a day       Xerosis secondary to isotretinoin therapy - Continue emollients as directed - Xyzal (levocetirizine) once a day and fish oil 1 gram daily may also help with dryness  Cheilitis secondary to isotretinoin therapy - Continue lip balm as directed, Dr. 05/24/2023 Cortibalm recommended  Long term medication management (isotretinoin) - While taking Isotretinoin and for 30 days after you finish the medication, do  not get pregnant, do not share pills, do not donate blood. Isotretinoin is best absorbed when taken with a fatty meal. Isotretinoin can make you sensitive to the sun. Daily careful sun protection including sunscreen SPF 30+ when outdoors is recommended.  Follow-up in 30 days.  IAngelique Holm, CMA, am acting as scribe for Armida Sans, MD .  Documentation: I have reviewed the above  documentation for accuracy and completeness, and I agree with the above.  Armida Sans, MD

## 2022-03-06 NOTE — Patient Instructions (Signed)
Due to recent changes in healthcare laws, you may see results of your pathology and/or laboratory studies on MyChart before the doctors have had a chance to review them. We understand that in some cases there may be results that are confusing or concerning to you. Please understand that not all results are received at the same time and often the doctors may need to interpret multiple results in order to provide you with the best plan of care or course of treatment. Therefore, we ask that you please give us 2 business days to thoroughly review all your results before contacting the office for clarification. Should we see a critical lab result, you will be contacted sooner.   If You Need Anything After Your Visit  If you have any questions or concerns for your doctor, please call our main line at [phone] and press option 4 to reach your doctor's medical assistant. If no one answers, please leave a voicemail as directed and we will return your call as soon as possible. Messages left after 4 pm will be answered the following business day.   You may also send us a message via MyChart. We typically respond to MyChart messages within 1-2 business days.  For prescription refills, please ask your pharmacy to contact our office. Our fax number is [phone].  If you have an urgent issue when the clinic is closed that cannot wait until the next business day, you can page your doctor at the number below.    Please note that while we do our best to be available for urgent issues outside of office hours, we are not available 24/7.   If you have an urgent issue and are unable to reach us, you may choose to seek medical care at your doctor's office, retail clinic, urgent care center, or emergency room.  If you have a medical emergency, please immediately call 911 or go to the emergency department.  Pager Numbers  - Dr. Kowalski: [phone]  - Dr. Moye: [phone]  - Dr. Stewart:  [phone]  In the event of inclement weather, please call our main line at [phone] for an update on the status of any delays or closures.  Dermatology Medication Tips: Please keep the boxes that topical medications come in in order to help keep track of the instructions about where and how to use these. Pharmacies typically print the medication instructions only on the boxes and not directly on the medication tubes.   If your medication is too expensive, please contact our office at [phone] option 4 or send us a message through MyChart.   We are unable to tell what your co-pay for medications will be in advance as this is different depending on your insurance coverage. However, we may be able to find a substitute medication at lower cost or fill out paperwork to get insurance to cover a needed medication.   If a prior authorization is required to get your medication covered by your insurance company, please allow us 1-2 business days to complete this process.  Drug prices often vary depending on where the prescription is filled and some pharmacies may offer cheaper prices.  The website www.goodrx.com contains coupons for medications through different pharmacies. The prices here do not account for what the cost may be with help from insurance (it may be cheaper with your insurance), but the website can give you the price if you did not use any insurance.  - You can print the associated coupon and take it with   your prescription to the pharmacy.  - You may also stop by our office during regular business hours and pick up a GoodRx coupon card.  - If you need your prescription sent electronically to a different pharmacy, notify our office through [facility] MyChart or by phone at [phone] option 4.     Si Usted Necesita Algo Despus de Su Visita  Tambin puede enviarnos un mensaje a travs de MyChart. Por lo general respondemos a los mensajes de MyChart en el transcurso de 1 a 2  das hbiles.  Para renovar recetas, por favor pida a su farmacia que se ponga en contacto con nuestra oficina. Nuestro nmero de fax es el [phone].  Si tiene un asunto urgente cuando la clnica est cerrada y que no puede esperar hasta el siguiente da hbil, puede llamar/localizar a su doctor(a) al nmero que aparece a continuacin.   Por favor, tenga en cuenta que aunque hacemos todo lo posible para estar disponibles para asuntos urgentes fuera del horario de oficina, no estamos disponibles las 24 horas del da, los 7 das de la semana.   Si tiene un problema urgente y no puede comunicarse con nosotros, puede optar por buscar atencin mdica  en el consultorio de su doctor(a), en una clnica privada, en un centro de atencin urgente o en una sala de emergencias.  Si tiene una emergencia mdica, por favor llame inmediatamente al 911 o vaya a la sala de emergencias.  Nmeros de bper  - Dr. Kowalski: [phone]  - Dra. Moye: [phone]  - Dra. Stewart: [phone]  En caso de inclemencias del tiempo, por favor llame a nuestra lnea principal al [phone] para una actualizacin sobre el estado de cualquier retraso o cierre.  Consejos para la medicacin en dermatologa: Por favor, guarde las cajas en las que vienen los medicamentos de uso tpico para ayudarle a seguir las instrucciones sobre dnde y cmo usarlos. Las farmacias generalmente imprimen las instrucciones del medicamento slo en las cajas y no directamente en los tubos del medicamento.   Si su medicamento es muy caro, por favor, pngase en contacto con nuestra oficina llamando al [phone] y presione la opcin 4 o envenos un mensaje a travs de MyChart.   No podemos decirle cul ser su copago por los medicamentos por adelantado ya que esto es diferente dependiendo de la cobertura de su seguro. Sin embargo, es posible que podamos encontrar un medicamento sustituto a menor costo o llenar un formulario para que el  seguro cubra el medicamento que se considera necesario.   Si se requiere una autorizacin previa para que su compaa de seguros cubra su medicamento, por favor permtanos de 1 a 2 das hbiles para completar este proceso.  Los precios de los medicamentos varan con frecuencia dependiendo del lugar de dnde se surte la receta y alguna farmacias pueden ofrecer precios ms baratos.  El sitio web www.goodrx.com tiene cupones para medicamentos de diferentes farmacias. Los precios aqu no tienen en cuenta lo que podra costar con la ayuda del seguro (puede ser ms barato con su seguro), pero el sitio web puede darle el precio si no utiliz ningn seguro.  - Puede imprimir el cupn correspondiente y llevarlo con su receta a la farmacia.  - Tambin puede pasar por nuestra oficina durante el horario de atencin regular y recoger una tarjeta de cupones de GoodRx.  - Si necesita que su receta se enve electrnicamente a una farmacia diferente, informe a nuestra oficina a travs de MyChart de [facility]   o por telfono llamando al [phone] y presione la opcin 4.  

## 2022-03-14 ENCOUNTER — Ambulatory Visit: Payer: Medicaid Other | Attending: Internal Medicine | Admitting: Internal Medicine

## 2022-03-14 ENCOUNTER — Encounter: Payer: Self-pay | Admitting: Internal Medicine

## 2022-03-14 VITALS — BP 112/77 | HR 86 | Resp 15 | Ht 59.0 in | Wt 240.0 lb

## 2022-03-14 DIAGNOSIS — M0880 Other juvenile arthritis, unspecified site: Secondary | ICD-10-CM | POA: Diagnosis not present

## 2022-03-14 DIAGNOSIS — L732 Hidradenitis suppurativa: Secondary | ICD-10-CM

## 2022-03-14 DIAGNOSIS — Z79899 Other long term (current) drug therapy: Secondary | ICD-10-CM

## 2022-03-14 DIAGNOSIS — M0600 Rheumatoid arthritis without rheumatoid factor, unspecified site: Secondary | ICD-10-CM

## 2022-03-14 DIAGNOSIS — E559 Vitamin D deficiency, unspecified: Secondary | ICD-10-CM

## 2022-03-14 NOTE — Progress Notes (Signed)
Office Visit Note  Patient: Angie Stone             Date of Birth: [birth year]           MRN: [id number]             PCP: Lucila Maine Referring: Teena Irani, PA-C Visit Date: 03/14/2022   Subjective:  Follow-up (Doing good)   History of Present Illness: Angie Stone is a 21 y.o. female here for follow up for rheumatoid arthritis with history of [name] on remicade infusion 1000 mg q6wks and rinvoq 15 mg daily.  Since her last visit she is doing overall okay but has experienced some increase in left knee pain with small amount of joint swelling.  She is also having ongoing hidradenitis symptoms.  She completed most recently a course of Keflex for associated abscess with improvement.  Currently still has some drainage coming from the lesions under her breast.  Had recent follow-up with dermatology clinic Dr. Gwen Pounds currently on isotretinoin 40 mg daily recommends several more months treatment.  Previous HPI 12/11/21 Angie Stone is a 21 y.o. female here for follow up for inflammatory arthritis with history of [name] on remicade infusion 1000 mg q6wks and rinvoq 15 mg daily.  She has not had any significant flareup of arthritis symptoms since her last visit.  She still has knee pain most commonly at nighttime not associated with significant swelling.  She continues to have a small area of skin numbness over decree sensation on the back of the right leg above the level of the knee.  She did have worsening hidradenitis lesion in the left axilla that required drainage and had delayed wound healing for about a month.  Before that have been about a year since requiring any aspiration or drainage for a chest.  Otherwise she remains on the low-dose doxycycline treatment.   Previous HPI 07/31/2021 Angie Stone is a 21 y.o. female here for follow up for inflammatory arthritis with history of [name] on remicade infusion 1000 mg q6wks and rinvoq 15 mg daily. Joint symptoms are mostly doing  well she had some days with increased knee pain and stiffness, no visible swelling, warmth, or redness. She notices joint pain is worse in the last 5th or 6th week between remicade infusion. Right axilla with few open sores draining other areas under arms and breasts about the same. She saw dermatology yesterday with recommendation of oral doxycycline and topical cleansing agent for hidradenitis symptoms.   Previous HPI 03/20/21 Angie Stone is a 21 y.o. 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  Symptoms remain well controlled no joint pain swelling or skin disease activity.   Previous HPI 03/06/20 Angie Stone is a 21 y.o. 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 She is looking to transfer care to [facility] from [facility] and [place] due to proximity and transitioning to adult rheumatology clinic. Currently she feels symptoms are very well controlled but she restarting the infliximab infusions round March due to symptoms increased on tofacitinib monotherapy.    She denies eye inflammation or visual acuity change. She does have recurrent skin lesions she describes as boils over the same area on her upper torso or axillary region. She denies cough, dyspnea, lymphadenopathy, oral ulcers, diarrhea, hematochezia, or alopecia.   She was originally diagnosed in 2010 with involvement of  bilateral hands, wrists, hips, knees, ankles, and TMJ. She has no history of uveitis. She underwent bilateral hip arthroplasty in 2015.   Previous treatments: Tocilizumab, methotrexate ~ 2014 Orencia, Kineret, methotrexate 2014-2015, 2016 Remicade, methotrexate Stephens 2016-2020 Sulfasalazine 2018-2019 not tolerated due to rash Leflunomide 2019 ineffective Tofacitinib 2019-current Infliximab resumed 05/2019 due to disease flare   Review of Systems   Constitutional:  Positive for fatigue.  HENT:  Negative for mouth sores and mouth dryness.   Eyes:  Negative for dryness.  Respiratory:  Negative for shortness of breath.   Cardiovascular:  Negative for chest pain and palpitations.  Gastrointestinal:  Negative for blood in stool, constipation and diarrhea.  Endocrine: Negative for increased urination.  Genitourinary:  Negative for involuntary urination.  Musculoskeletal:  Positive for joint pain, joint pain, myalgias, morning stiffness and myalgias. Negative for gait problem, joint swelling, muscle weakness and muscle tenderness.  Skin:  Negative for color change, rash, hair loss and sensitivity to sunlight.  Allergic/Immunologic: Positive for susceptible to infections.  Neurological:  Negative for dizziness and headaches.  Hematological:  Negative for swollen glands.  Psychiatric/Behavioral:  Positive for sleep disturbance. Negative for depressed mood. The patient is not nervous/anxious.     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    Past Medical History:  Diagnosis Date   Immunosuppression (HCC)    [name] (juvenile idiopathic arthritis) (HCC)     Family History  Problem Relation Age of Onset   Diabetes Mother    Diabetes Maternal Grandmother    Arthritis Paternal Grandmother    Past Surgical History:  Procedure Laterality Date   JOINT REPLACEMENT Bilateral    Hips   Social History   Social History Narrative   Not on file   Immunization History  Administered Date(s) Administered   Moderna Sars-Covid-2  Vaccination 02/18/2020   PFIZER(Purple Top)SARS-COV-2 Vaccination 02/18/2020, 03/22/2020     Objective: Vital Signs: BP 112/77 (BP Location: Left Arm, Patient Position: Sitting, Cuff Size: Large)   Pulse 86   Resp 15   Ht 4\' 11"  (1.499 m)   Wt 240 lb (108.9 kg)   BMI 48.47 kg/m    Physical Exam Constitutional:      Appearance: She is obese.  Eyes:     Conjunctiva/sclera: Conjunctivae normal.  Cardiovascular:     Rate and Rhythm: Normal rate and regular rhythm.  Pulmonary:     Effort: Pulmonary effort is normal.     Breath sounds: Normal breath sounds.  Musculoskeletal:     Right lower leg: No edema.     Left lower leg: No edema.  Lymphadenopathy:     Cervical: No cervical adenopathy.  Neurological:     Mental Status: She is alert.  Psychiatric:        Mood and Affect: Mood normal.      Musculoskeletal Exam:  Shoulders full ROM no tenderness or swelling Elbows full ROM no tenderness or swelling Wrists full ROM no tenderness or swelling Fingers full ROM no tenderness or swelling Hip normal internal and external rotation without pain, no tenderness to lateral hip palpation Left knee small joint effusion  present and decreased flexion range of motion with some pain, no joint instability or tenderness to palpation, right knee normal Ankles full ROM no tenderness or swelling   CDAI Exam: CDAI Score: 6  Patient Global: 20 mm; Provider Global: 20 mm Swollen: 1 ; Tender: 1  Joint Exam 03/14/2022      Right  Left  Knee     Swollen Tender     Investigation: No additional findings.  Imaging: No results found.  Recent Labs: Lab Results  Component Value Date   WBC 8.2 01/08/2022   HGB 12.8 01/08/2022   PLT 369 01/08/2022   NA 137 01/08/2022   K 4.1 01/08/2022   CL 109 01/08/2022   CO2 23 01/08/2022   GLUCOSE 84 01/08/2022   BUN 6 01/08/2022   CREATININE 0.78 01/08/2022   BILITOT 0.7 01/08/2022   ALKPHOS 52 01/08/2022   AST 21 01/08/2022   ALT 16  01/08/2022   PROT 7.2 01/08/2022   ALBUMIN 3.5 01/08/2022   CALCIUM 9.1 01/08/2022   GFRAA 144 09/13/2020   QFTBGOLDPLUS Negative 01/08/2022    Speciality Comments: Diagnosed in 2010. 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  She is currently on winter break so not sure of any activity that would provoke this.  Sometimes arthritis could be more symptomatic with cold exposure.  Will recheck sedimentation rate for disease activity monitoring although I think the skin symptoms are more impactful on this currently.  Will recheck infliximab antidrug antibody levels were negative 2 years ago, she is only about 2 weeks since last infusion so unable to take trough level today.  Hidradenitis suppurativa  Skin disease remains moderately active has some drainage from underneath the breast area though axillary inflammation is improved.  She has regular follow-up with dermatology currently on isotretinoin apparently another 4 months treatment planned at this point.  She feels overall this year her skin disease has been more symptomatic than her arthritis.  Vitamin D deficiency - Plan: VITAMIN D 25 Hydroxy (Vit-D Deficiency, Fractures)  Reports currently on maintenance 1000 units daily vitamin D supplementation.  She was  still deficient has not been repeated this year we will check again to see if this is replete.  She is higher risk for osteoporosis long-term due to chronic inflammatory arthritis.  High risk medication use - Plan: CBC with Differential/Platelet, COMPLETE METABOLIC PANEL WITH GFR, INFLIXIMAB ADA, RHEUM  Checking CBC and CMP for medication monitoring on continued infliximab and rinvoq treatment.  She was treated with antibiotics for soft tissue infection but more associated with the hidradenitis lesions I doubt related to her immunosuppressive treatment.  Orders: Orders Placed This Encounter  Procedures   CBC with Differential/Platelet   COMPLETE METABOLIC PANEL WITH GFR   Sedimentation rate   VITAMIN D 25 Hydroxy (Vit-D Deficiency, Fractures)   INFLIXIMAB ADA, RHEUM   No orders of the defined types were placed in this  encounter.    Follow-Up Instructions: Return in about 3 months (around 06/13/2022) for RA/HS on INF/RIN f/u 19mos.   Fuller Plan, MD  Note - This record has been created using [employer].  Chart creation errors have been sought, but may not always  have been located. Such creation errors do not reflect on  the standard of medical care.

## 2022-03-17 ENCOUNTER — Encounter: Payer: Self-pay | Admitting: Dermatology

## 2022-03-18 ENCOUNTER — Encounter: Payer: Self-pay | Admitting: Internal Medicine

## 2022-03-19 LAB — COMPLETE METABOLIC PANEL WITH GFR
AG Ratio: 1.1 (calc) (ref 1.0–2.5)
ALT: 13 U/L (ref 6–29)
AST: 16 U/L (ref 10–30)
Albumin: 4.1 g/dL (ref 3.6–5.1)
Alkaline phosphatase (APISO): 77 U/L (ref 31–125)
BUN/Creatinine Ratio: 9 (calc) (ref 6–22)
BUN: 6 mg/dL — ABNORMAL LOW (ref 7–25)
CO2: 23 mmol/L (ref 20–32)
Calcium: 9.8 mg/dL (ref 8.6–10.2)
Chloride: 106 mmol/L (ref 98–110)
Creat: 0.69 mg/dL (ref 0.50–0.96)
Globulin: 3.9 g/dL (calc) — ABNORMAL HIGH (ref 1.9–3.7)
Glucose, Bld: 84 mg/dL (ref 65–99)
Potassium: 4.3 mmol/L (ref 3.5–5.3)
Sodium: 139 mmol/L (ref 135–146)
Total Bilirubin: 0.3 mg/dL (ref 0.2–1.2)
Total Protein: 8 g/dL (ref 6.1–8.1)
eGFR: 127 mL/min/{1.73_m2} (ref >=60)

## 2022-03-19 LAB — CBC WITH DIFFERENTIAL/PLATELET
Absolute Monocytes: 1070 cells/uL — ABNORMAL HIGH (ref 200–950)
Basophils Absolute: 44 cells/uL (ref 0–200)
Basophils Relative: 0.5 %
Eosinophils Absolute: 122 cells/uL (ref 15–500)
Eosinophils Relative: 1.4 %
HCT: 36.1 % (ref 35.0–45.0)
Hemoglobin: 12.2 g/dL (ref 11.7–15.5)
Lymphs Abs: 2906 cells/uL (ref 850–3900)
MCH: 29.1 pg (ref 27.0–33.0)
MCHC: 33.8 g/dL (ref 32.0–36.0)
MCV: 86.2 fL (ref 80.0–100.0)
MPV: 8.9 fL (ref 7.5–12.5)
Monocytes Relative: 12.3 %
Neutro Abs: 4559 cells/uL (ref 1500–7800)
Neutrophils Relative %: 52.4 %
Platelets: 406 10*3/uL — ABNORMAL HIGH (ref 140–400)
RBC: 4.19 10*6/uL (ref 3.80–5.10)
RDW: 13 % (ref 11.0–15.0)
Total Lymphocyte: 33.4 %
WBC: 8.7 10*3/uL (ref 3.8–10.8)

## 2022-03-19 LAB — SEDIMENTATION RATE: Sed Rate: 129 mm/h — ABNORMAL HIGH (ref 0–20)

## 2022-03-19 LAB — INFLIXIMAB ADA, RHEUM: INFLIXIMAB ADA, RHEUM: <10 AU (ref <10)

## 2022-03-19 LAB — VITAMIN D 25 HYDROXY (VIT D DEFICIENCY, FRACTURES): Vit D, 25-Hydroxy: 39 ng/mL (ref 30–100)

## 2022-03-21 NOTE — Progress Notes (Signed)
Lab results look okay for continuing the infliximab and rinvoq medications.  There were no evidence of antidrug antibodies.  The sedimentation rate test remains very high at 129 I suspect mostly coming from the skin more than the joints although there was some joint inflammation too.  Her vitamin D level has improved up to a normal amount at 39.  She no longer needs to take the super high-dose weekly supplement but should start taking a over-the-counter supplement of 1000 to 2000 units daily.

## 2022-04-01 ENCOUNTER — Encounter: Payer: Self-pay | Admitting: Dermatology

## 2022-04-02 ENCOUNTER — Encounter (HOSPITAL_COMMUNITY): Payer: Medicaid Other

## 2022-04-08 ENCOUNTER — Ambulatory Visit (INDEPENDENT_AMBULATORY_CARE_PROVIDER_SITE_OTHER): Payer: Medicaid Other | Admitting: Dermatology

## 2022-04-08 ENCOUNTER — Encounter: Payer: Self-pay | Admitting: Dermatology

## 2022-04-08 DIAGNOSIS — L732 Hidradenitis suppurativa: Secondary | ICD-10-CM

## 2022-04-08 DIAGNOSIS — Z79899 Other long term (current) drug therapy: Secondary | ICD-10-CM

## 2022-04-08 NOTE — Progress Notes (Signed)
   Follow-Up Visit   Subjective  Angie Stone is a 22 y.o. female who presents for the following: Follow-up (Hidradenitis 1 month follow up - week #12 - Isotretinoin 40 mg 1 po qd. She stopped taking isotretinoin last Friday because she felt like it was causing her flares to be worse. She would like to discuss other treatment options today. She uses CLN wash.She has been on Doxycycline in the past./). Patient is also on Remicade for rheumatoid arthritis. Accompanied by mother  The following portions of the chart were reviewed this encounter and updated as appropriate:   Tobacco  Allergies  Meds  Problems  Med Hx  Surg Hx  Fam Hx     Review of Systems:  No other skin or systemic complaints except as noted in HPI or Assessment and Plan.  Objective  Well appearing patient in no apparent distress; mood and affect are within normal limits.  A focused examination was performed including axillary areas, breasts. Relevant physical exam findings are noted in the Assessment and Plan.  Active draining areas of axillary areas and breasts.   Assessment & Plan  Hidradenitis suppurativa Axilla groin breasts  Chronic and persistent condition with duration or expected duration over one year. Condition is bothersome/symptomatic for patient. Currently flared.  patient had to discontinue isotretinoin due to increased painful flares.  Discussed Cosentyx as alternate treatment.  (Humira is also FDA approved for hidradenitis, but if she is not improving on Remicade, I would not think she would improve on Humira, another TNF alpha inhibitor.  ).  Recommend consideration for starting Cosentyx but will need to consult with her rheumatologist as she is on Remicade every 6 weeks for her arthritis. May consider switching from Remicade to Cosentyx which may help hidradenitis as well as rheumatoid arthritis, both.  Reviewed patients labs - Hep B (2020), Hep C and HIV (2021), Quantiferon Gold (01/08/2022),  CMP and CBC (03/14/2022)  Return in about 1 month (around 05/09/2022).  I, Ashok Cordia, CMA, am acting as scribe for Sarina Ser, MD . Documentation: I have reviewed the above documentation for accuracy and completeness, and I agree with the above.  Sarina Ser, MD

## 2022-04-08 NOTE — Patient Instructions (Signed)
Due to recent changes in healthcare laws, you may see results of your pathology and/or laboratory studies on MyChart before the doctors have had a chance to review them. We understand that in some cases there may be results that are confusing or concerning to you. Please understand that not all results are received at the same time and often the doctors may need to interpret multiple results in order to provide you with the best plan of care or course of treatment. Therefore, we ask that you please give us 2 business days to thoroughly review all your results before contacting the office for clarification. Should we see a critical lab result, you will be contacted sooner.   If You Need Anything After Your Visit  If you have any questions or concerns for your doctor, please call our main line at [phone] and press option 4 to reach your doctor's medical assistant. If no one answers, please leave a voicemail as directed and we will return your call as soon as possible. Messages left after 4 pm will be answered the following business day.   You may also send us a message via MyChart. We typically respond to MyChart messages within 1-2 business days.  For prescription refills, please ask your pharmacy to contact our office. Our fax number is [phone].  If you have an urgent issue when the clinic is closed that cannot wait until the next business day, you can page your doctor at the number below.    Please note that while we do our best to be available for urgent issues outside of office hours, we are not available 24/7.   If you have an urgent issue and are unable to reach us, you may choose to seek medical care at your doctor's office, retail clinic, urgent care center, or emergency room.  If you have a medical emergency, please immediately call 911 or go to the emergency department.  Pager Numbers  - Dr. Kowalski: [phone]  - Dr. Moye: [phone]  - Dr. Stewart:  [phone]  In the event of inclement weather, please call our main line at [phone] for an update on the status of any delays or closures.  Dermatology Medication Tips: Please keep the boxes that topical medications come in in order to help keep track of the instructions about where and how to use these. Pharmacies typically print the medication instructions only on the boxes and not directly on the medication tubes.   If your medication is too expensive, please contact our office at [phone] option 4 or send us a message through MyChart.   We are unable to tell what your co-pay for medications will be in advance as this is different depending on your insurance coverage. However, we may be able to find a substitute medication at lower cost or fill out paperwork to get insurance to cover a needed medication.   If a prior authorization is required to get your medication covered by your insurance company, please allow us 1-2 business days to complete this process.  Drug prices often vary depending on where the prescription is filled and some pharmacies may offer cheaper prices.  The website www.goodrx.com contains coupons for medications through different pharmacies. The prices here do not account for what the cost may be with help from insurance (it may be cheaper with your insurance), but the website can give you the price if you did not use any insurance.  - You can print the associated coupon and take it with   your prescription to the pharmacy.  - You may also stop by our office during regular business hours and pick up a GoodRx coupon card.  - If you need your prescription sent electronically to a different pharmacy, notify our office through [facility] MyChart or by phone at [phone] option 4.     Si Usted Necesita Algo Despus de Su Visita  Tambin puede enviarnos un mensaje a travs de MyChart. Por lo general respondemos a los mensajes de MyChart en el transcurso de 1 a 2  das hbiles.  Para renovar recetas, por favor pida a su farmacia que se ponga en contacto con nuestra oficina. Nuestro nmero de fax es el [phone].  Si tiene un asunto urgente cuando la clnica est cerrada y que no puede esperar hasta el siguiente da hbil, puede llamar/localizar a su doctor(a) al nmero que aparece a continuacin.   Por favor, tenga en cuenta que aunque hacemos todo lo posible para estar disponibles para asuntos urgentes fuera del horario de oficina, no estamos disponibles las 24 horas del da, los 7 das de la semana.   Si tiene un problema urgente y no puede comunicarse con nosotros, puede optar por buscar atencin mdica  en el consultorio de su doctor(a), en una clnica privada, en un centro de atencin urgente o en una sala de emergencias.  Si tiene una emergencia mdica, por favor llame inmediatamente al 911 o vaya a la sala de emergencias.  Nmeros de bper  - Dr. Kowalski: [phone]  - Dra. Moye: [phone]  - Dra. Stewart: [phone]  En caso de inclemencias del tiempo, por favor llame a nuestra lnea principal al [phone] para una actualizacin sobre el estado de cualquier retraso o cierre.  Consejos para la medicacin en dermatologa: Por favor, guarde las cajas en las que vienen los medicamentos de uso tpico para ayudarle a seguir las instrucciones sobre dnde y cmo usarlos. Las farmacias generalmente imprimen las instrucciones del medicamento slo en las cajas y no directamente en los tubos del medicamento.   Si su medicamento es muy caro, por favor, pngase en contacto con nuestra oficina llamando al [phone] y presione la opcin 4 o envenos un mensaje a travs de MyChart.   No podemos decirle cul ser su copago por los medicamentos por adelantado ya que esto es diferente dependiendo de la cobertura de su seguro. Sin embargo, es posible que podamos encontrar un medicamento sustituto a menor costo o llenar un formulario para que el  seguro cubra el medicamento que se considera necesario.   Si se requiere una autorizacin previa para que su compaa de seguros cubra su medicamento, por favor permtanos de 1 a 2 das hbiles para completar este proceso.  Los precios de los medicamentos varan con frecuencia dependiendo del lugar de dnde se surte la receta y alguna farmacias pueden ofrecer precios ms baratos.  El sitio web www.goodrx.com tiene cupones para medicamentos de diferentes farmacias. Los precios aqu no tienen en cuenta lo que podra costar con la ayuda del seguro (puede ser ms barato con su seguro), pero el sitio web puede darle el precio si no utiliz ningn seguro.  - Puede imprimir el cupn correspondiente y llevarlo con su receta a la farmacia.  - Tambin puede pasar por nuestra oficina durante el horario de atencin regular y recoger una tarjeta de cupones de GoodRx.  - Si necesita que su receta se enve electrnicamente a una farmacia diferente, informe a nuestra oficina a travs de MyChart de    o por telfono llamando al [phone] y presione la opcin 4.  

## 2022-04-11 ENCOUNTER — Other Ambulatory Visit: Payer: Self-pay | Admitting: Pharmacist

## 2022-04-11 ENCOUNTER — Ambulatory Visit (HOSPITAL_COMMUNITY)
Admission: RE | Admit: 2022-04-11 | Discharge: 2022-04-11 | Disposition: A | Discharge status: Home / Self Care | Payer: Medicaid Other | Source: Ambulatory Visit | Attending: Internal Medicine | Admitting: Internal Medicine

## 2022-04-11 VITALS — BP 107/80 | HR 96 | Temp 98.0°F | Resp 18 | Wt 240.0 lb

## 2022-04-11 DIAGNOSIS — M0880 Other juvenile arthritis, unspecified site: Secondary | ICD-10-CM

## 2022-04-11 DIAGNOSIS — M0600 Rheumatoid arthritis without rheumatoid factor, unspecified site: Secondary | ICD-10-CM | POA: Diagnosis present

## 2022-04-11 DIAGNOSIS — L732 Hidradenitis suppurativa: Secondary | ICD-10-CM | POA: Diagnosis present

## 2022-04-11 DIAGNOSIS — Z79899 Other long term (current) drug therapy: Secondary | ICD-10-CM

## 2022-04-11 LAB — CBC WITH DIFFERENTIAL/PLATELET
Abs Immature Granulocytes: 0.09 10*3/uL — ABNORMAL HIGH (ref 0.00–0.07)
Basophils Absolute: 0.1 10*3/uL (ref 0.0–0.1)
Basophils Relative: 1 %
Eosinophils Absolute: 0.1 10*3/uL (ref 0.0–0.5)
Eosinophils Relative: 1 %
HCT: 34.7 % — ABNORMAL LOW (ref 36.0–46.0)
Hemoglobin: 11.1 g/dL — ABNORMAL LOW (ref 12.0–15.0)
Immature Granulocytes: 1 %
Lymphocytes Relative: 33 %
Lymphs Abs: 3.4 10*3/uL (ref 0.7–4.0)
MCH: 28.8 pg (ref 26.0–34.0)
MCHC: 32 g/dL (ref 30.0–36.0)
MCV: 89.9 fL (ref 80.0–100.0)
Monocytes Absolute: 1.4 10*3/uL — ABNORMAL HIGH (ref 0.1–1.0)
Monocytes Relative: 13 %
Neutro Abs: 5.3 10*3/uL (ref 1.7–7.7)
Neutrophils Relative %: 51 %
Platelets: 427 10*3/uL — ABNORMAL HIGH (ref 150–400)
RBC: 3.86 MIL/uL — ABNORMAL LOW (ref 3.87–5.11)
RDW: 14.1 % (ref 11.5–15.5)
WBC: 10.4 10*3/uL (ref 4.0–10.5)
nRBC: 0 % (ref 0.0–0.2)

## 2022-04-11 LAB — COMPREHENSIVE METABOLIC PANEL
ALT: 18 U/L (ref 0–44)
AST: 20 U/L (ref 15–41)
Albumin: 3.4 g/dL — ABNORMAL LOW (ref 3.5–5.0)
Alkaline Phosphatase: 69 U/L (ref 38–126)
Anion gap: 9 (ref 5–15)
BUN: 7 mg/dL (ref 6–20)
CO2: 21 mmol/L — ABNORMAL LOW (ref 22–32)
Calcium: 9.1 mg/dL (ref 8.9–10.3)
Chloride: 107 mmol/L (ref 98–111)
Creatinine, Ser: 0.8 mg/dL (ref 0.44–1.00)
GFR, Estimated: >60 mL/min (ref >60)
Glucose, Bld: 90 mg/dL (ref 70–99)
Potassium: 3.8 mmol/L (ref 3.5–5.1)
Sodium: 137 mmol/L (ref 135–145)
Total Bilirubin: 0.4 mg/dL (ref 0.3–1.2)
Total Protein: 7.9 g/dL (ref 6.5–8.1)

## 2022-04-11 MED ORDER — ACETAMINOPHEN 325 MG PO TABS
ORAL_TABLET | ORAL | Status: AC
  Administered 2022-04-11: 650 mg via ORAL
  Filled 2022-04-11: qty 2

## 2022-04-11 MED ORDER — DIPHENHYDRAMINE HCL 25 MG PO CAPS: 25.0000 mg | ORAL_CAPSULE | ORAL | Status: DC

## 2022-04-11 MED ORDER — SODIUM CHLORIDE 0.9 % IV SOLN
1000.0000 mg | INTRAVENOUS | Status: DC
  Administered 2022-04-11: 1000 mg via INTRAVENOUS
  Filled 2022-04-11: qty 100

## 2022-04-11 MED ORDER — DIPHENHYDRAMINE HCL 25 MG PO CAPS
ORAL_CAPSULE | ORAL | Status: AC
  Administered 2022-04-11: 25 mg via ORAL
  Filled 2022-04-11: qty 1

## 2022-04-11 MED ORDER — METHYLPREDNISOLONE SODIUM SUCC 40 MG IJ SOLR: 40.0000 mg | INTRAMUSCULAR | Status: DC

## 2022-04-11 MED ORDER — METHYLPREDNISOLONE SODIUM SUCC 40 MG IJ SOLR
INTRAMUSCULAR | Status: AC
  Administered 2022-04-11: 40 mg via INTRAVENOUS
  Filled 2022-04-11: qty 1

## 2022-04-11 MED ORDER — ACETAMINOPHEN 325 MG PO TABS: 650.0000 mg | ORAL_TABLET | ORAL | Status: DC

## 2022-04-11 NOTE — Progress Notes (Signed)
Orders for Remicade not renewed as patient is switching to Cosentyx SQ on 05/23/2022  Knox Saliva, PharmD, MPH, BCPS, CPP Clinical Pharmacist (Rheumatology and Pulmonology)

## 2022-04-12 NOTE — Progress Notes (Signed)
Lab result shows a small increase in platelet count this may be related to active inflammation. We are planning to discuss possible treatment change at upcoming appointment already that might work better.

## 2022-04-16 ENCOUNTER — Encounter: Payer: Self-pay | Admitting: Internal Medicine

## 2022-04-16 ENCOUNTER — Ambulatory Visit: Payer: Medicaid Other | Attending: Internal Medicine | Admitting: Internal Medicine

## 2022-04-16 VITALS — BP 128/79 | HR 108 | Resp 15 | Ht 59.0 in | Wt 239.0 lb

## 2022-04-16 DIAGNOSIS — Z79899 Other long term (current) drug therapy: Secondary | ICD-10-CM | POA: Diagnosis not present

## 2022-04-16 DIAGNOSIS — M0600 Rheumatoid arthritis without rheumatoid factor, unspecified site: Secondary | ICD-10-CM

## 2022-04-16 DIAGNOSIS — L732 Hidradenitis suppurativa: Secondary | ICD-10-CM | POA: Diagnosis not present

## 2022-04-16 DIAGNOSIS — M0880 Other juvenile arthritis, unspecified site: Secondary | ICD-10-CM

## 2022-04-16 NOTE — Patient Instructions (Signed)
Secukinumab Injection What is this medication? SECUKINUMAB (sek ue KIN ue mab) treats autoimmune conditions, such as psoriasis and arthritis. It works by slowing down an overactive immune system. It is a monoclonal antibody. This medicine may be used for other purposes; ask your health care provider or pharmacist if you have questions. COMMON BRAND NAME(S): Cosentyx What should I tell my care team before I take this medication? They need to know if you have any of these conditions: Crohn's disease, ulcerative colitis, or other inflammatory bowel disease Immune system problems Infection or history of infection, such as a viral infection, chickenpox, cold sores, or herpes Recently received or are scheduled to receive a vaccine Tuberculosis, a positive skin test for tuberculosis, or recent close contact with someone who has tuberculosis An unusual or allergic reaction to secukinumab, latex, rubber, other medications, foods, dyes, or preservatives Pregnant or trying to get pregnant Breast-feeding How should I use this medication? This medication is injected under the skin. It can be given by your care team in a hospital or clinic setting. It may also be given at home. If you get this medication at home, you will be taught how to prepare and give it. Use it exactly as directed. Take it as directed on the prescription label. Keep taking it unless your care team tells you to stop. It is important that you put your used needles and syringes in a special sharps container. Do not put them in a trash can. If you do not have a sharps container, call your pharmacist or care team to get one. A special MedGuide will be given to you by the pharmacist with each prescription and refill. Be sure to read this information carefully each time. Talk to your care team about the use of this medication in children. While it may be prescribed for children as young as 2 years for selected conditions, precautions do  apply. Overdosage: If you think you have taken too much of this medicine contact a poison control center or emergency room at once. NOTE: This medicine is only for you. Do not share this medicine with others. What if I miss a dose? If you get this medication at the hospital or clinic: It is important not to miss your dose. Call your care team if you are unable to keep an appointment. If you give yourself this medication at home: If you miss a dose, take it as soon as you can. If it is almost time for your next dose, take only that dose. Do not take double or extra doses. Call your care team with questions. What may interact with this medication? Live virus vaccines This list may not describe all possible interactions. Give your health care provider a list of all the medicines, herbs, non-prescription drugs, or dietary supplements you use. Also tell them if you smoke, drink alcohol, or use illegal drugs. Some items may interact with your medicine. What should I watch for while using this medication? Visit your care team for regular checks on your progress. Tell your care team if your symptoms do not start to get better or if they get worse. You will be tested for tuberculosis (TB) before you start this medication. If your care team prescribes any medication for TB, you should start taking the TB medication before starting this medication. Make sure to finish the full course of TB medication. This medication may increase your risk of getting an infection. Call your care team for advice if you get a fever,  chills, sore throat, or other symptoms of a cold or flu. Do not treat yourself. Try to avoid being around people who are sick. This medication can decrease the response to a vaccine. If you need to get vaccinated, tell your care team if you have received this medication within the last 6 months. Extra booster doses may be needed. Talk to your care team to see if a different vaccination schedule is  needed. What side effects may I notice from receiving this medication? Side effects that you should report to your care team as soon as possible: Allergic reactions--skin rash, itching, hives, swelling of the face, lips, tongue, or throat Dry, itchy, scaly patches of skin that blister or peel Infection--fever, chills, cough, sore throat, wounds that don't heal, pain or trouble when passing urine, general feeling of discomfort or being unwell Sudden or severe stomach pain, bloody diarrhea, fever, nausea, vomiting Side effects that usually do not require medical attention (report these to your care team if they continue or are bothersome): Diarrhea Runny or stuffy nose Sore throat This list may not describe all possible side effects. Call your doctor for medical advice about side effects. You may report side effects to FDA at 1-800-FDA-1088. Where should I keep my medication? Keep out of the reach of children and pets. Store in the refrigerator. Do not freeze. Keep it in the original carton until you are ready to use it. Protect from light. Do not shake. Remove the dose from the refrigerator about 30 minutes before it is time for you to use it. Use it within 4 days of removing it from the carton. Get rid of any unused medication after the expiration date. To get rid of medications that are no longer needed or have expired: Take the medication to a medication take-back program. Check with your pharmacy or law enforcement to find a location. If you cannot return the medication, ask your pharmacist or care team how to get rid of this medication safely. NOTE: This sheet is a summary. It may not cover all possible information. If you have questions about this medicine, talk to your doctor, pharmacist, or health care provider.  2023 Elsevier/Gold Standard (2013-04-22 00:00:00)

## 2022-04-16 NOTE — Progress Notes (Signed)
Office Visit Note  Patient: Angie Stone             Date of Birth: 08-Aug-2000           MRN: [id number]             PCP: Selinda Orion Referring: Aura Dials, PA-C Visit Date: 04/16/2022   Subjective:  Follow-up (Doing good)   History of Present Illness: Angie Stone is a 22 y.o. female here for follow up for RA with history of [name] and hydradenitis suppurativa on remicade 1000 mg q6wks and rinvoq 15 mg daily. She saw her dermatologist Dr. Nehemiah Massed recently and with ongoing HS inflammation recently concerning for decreased effectiveness of her treatment regimen. The wounds are partially improved today, several open lesions in axillae but no longer raised or draining fluid. Joint pain is slightly worse in the knees she associated with colder weather as there is no increase in swelling.  Previous HPI 03/14/22 Angie Stone is a 22 y.o. female here for follow up for rheumatoid arthritis with history of [name] on remicade infusion 1000 mg q6wks and rinvoq 15 mg daily.  Since her last visit she is doing overall okay but has experienced some increase in left knee pain with small amount of joint swelling.  She is also having ongoing hidradenitis symptoms.  She completed most recently a course of Keflex for associated abscess with improvement.  Currently still has some drainage coming from the lesions under her breast.  Had recent follow-up with dermatology clinic Dr. Nehemiah Massed currently on isotretinoin 40 mg daily recommends several more months treatment.   Previous HPI 12/11/21 Angie Stone is a 22 y.o. female here for follow up for inflammatory arthritis with history of [name] on remicade infusion 1000 mg q6wks and rinvoq 15 mg daily.  She has not had any significant flareup of arthritis symptoms since her last visit.  She still has knee pain most commonly at nighttime not associated with significant swelling.  She continues to have a small area of skin numbness over decree sensation  on the back of the right leg above the level of the knee.  She did have worsening hidradenitis lesion in the left axilla that required drainage and had delayed wound healing for about a month.  Before that have been about a year since requiring any aspiration or drainage for a chest.  Otherwise she remains on the low-dose doxycycline treatment.   Previous HPI 07/31/2021 Angie Stone is a 22 y.o. female here for follow up for inflammatory arthritis with history of [name] on remicade infusion 1000 mg q6wks and rinvoq 15 mg daily. Joint symptoms are mostly doing well she had some days with increased knee pain and stiffness, no visible swelling, warmth, or redness. She notices joint pain is worse in the last 5th or 6th week between remicade infusion. Right axilla with few open sores draining other areas under arms and breasts about the same. She saw dermatology yesterday with recommendation of oral doxycycline and topical cleansing agent for hidradenitis symptoms.   Previous HPI 03/20/21 Angie Stone is a 22 y.o. 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  Symptoms remain well controlled no joint pain swelling or skin disease activity.   Previous HPI 03/06/20 Angie Stone is a 22 y.o. 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 She is looking to  transfer care to [place] from [place] due to proximity and transitioning to adult rheumatology clinic. Currently she feels symptoms are very well controlled but she restarting the infliximab infusions round March due to symptoms increased on tofacitinib monotherapy.    She denies eye inflammation or visual acuity change. She does have recurrent skin lesions she describes as boils over the same area on her upper torso or axillary region. She denies cough, dyspnea, lymphadenopathy, oral  ulcers, diarrhea, hematochezia, or alopecia.   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 She has no history of uveitis. She underwent bilateral hip arthroplasty in 2015.   Previous treatments: Tocilizumab, methotrexate ~ 2014 Orencia, Kineret, methotrexate 2014-2015, 2016 Remicade, methotrexate [place] 2016-2020 Sulfasalazine 2018-2019 not tolerated due to rash Leflunomide 2019 ineffective Tofacitinib 2019-current Infliximab resumed 05/2019 due to disease flare   Review of Systems  Constitutional:  Positive for fatigue.  HENT:  Negative for mouth sores and mouth dryness.   Eyes:  Negative for dryness.  Respiratory:  Negative for shortness of breath.   Cardiovascular:  Negative for chest pain and palpitations.  Gastrointestinal:  Negative for blood in stool, constipation and diarrhea.  Endocrine: Negative for increased urination.  Genitourinary:  Negative for involuntary urination.  Musculoskeletal:  Positive for joint pain, joint pain, myalgias, morning stiffness and myalgias. Negative for gait problem, joint swelling, muscle weakness and muscle tenderness.  Skin:  Negative for color change, rash, hair loss and sensitivity to sunlight.  Allergic/Immunologic: Positive for susceptible to infections.  Neurological:  Negative for dizziness and headaches.  Hematological:  Negative for swollen glands.  Psychiatric/Behavioral:  Positive for sleep disturbance. Negative for depressed mood. The patient is not nervous/anxious.     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    Past Medical History:  Diagnosis Date   Immunosuppression ([place])    [name] (juvenile idiopathic arthritis) ([place])     Family History  Problem Relation Age of Onset   Diabetes Mother    Diabetes Maternal Grandmother    Arthritis Paternal Grandmother    Past Surgical History:  Procedure Laterality Date   JOINT REPLACEMENT Bilateral    Hips   Social History   Social History Narrative   Not on file   Immunization History  Administered Date(s) Administered   Moderna Sars-Covid-2 Vaccination 02/18/2020   PFIZER(Purple Top)SARS-COV-2 Vaccination 02/18/2020, 03/22/2020     Objective: Vital Signs: BP 128/79 (BP Location: Left Arm, Patient Position: Sitting, Cuff Size: Normal)   Pulse (!) 108   Resp 15   Ht 4\' 11"  (1.499 m)   Wt 239 lb (108.4 kg)   BMI 48.27 kg/m    Physical Exam Constitutional:      Appearance: She is obese.  Eyes:     Conjunctiva/sclera: Conjunctivae normal.  Cardiovascular:     Rate and Rhythm: Normal rate and regular rhythm.  Pulmonary:     Effort: Pulmonary effort is normal.     Breath sounds: Normal breath sounds.  Lymphadenopathy:     Cervical: No cervical adenopathy.  Skin:  General: Skin is warm and dry.     Findings: Rash present.     Comments: Several closed hypopigmented lesions, few flat, open lesions with dressing in place under both left and aright arms  Neurological:     Mental Status: She is alert.  Psychiatric:        Mood and Affect: Mood normal.      Musculoskeletal Exam:  Shoulders full ROM no tenderness or swelling Elbows full ROM no tenderness or swelling Wrists ROM restricted bilaterally, no palpable swelling Fingers full ROM no tenderness or swelling Knees full ROM no swelling, mild joint line tenderness to pressure Ankles full ROM no tenderness or swelling   Investigation: No additional findings.  Imaging: No results found.  Recent Labs: Lab  Results  Component Value Date   WBC 10.4 04/11/2022   HGB 11.1 (L) 04/11/2022   PLT 427 (H) 04/11/2022   NA 137 04/11/2022   K 3.8 04/11/2022   CL 107 04/11/2022   CO2 21 (L) 04/11/2022   GLUCOSE 90 04/11/2022   BUN 7 04/11/2022   CREATININE 0.80 04/11/2022   BILITOT 0.4 04/11/2022   ALKPHOS 69 04/11/2022   AST 20 04/11/2022   ALT 18 04/11/2022   PROT 7.9 04/11/2022   ALBUMIN 3.4 (L) 04/11/2022   CALCIUM 9.1 04/11/2022   GFRAA 144 09/13/2020   QFTBGOLDPLUS Negative 01/08/2022    Speciality Comments: Diagnosed in 2010. 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 I suspect current symptoms more related to her existing mild secondary OA, or possibly has some very low disease activity.  Hidradenitis suppurativa  Skin disease appears to be the more active problem requiring multiple interventions including I&D, doxycycline, isotretinoin treatments due to worse controled on maintenance DMARDs. Appreciate input from dermatology considering change to cosentyx for this. Recommend keeping her on rinvoq concurrently to reduce risk of flare up when discontinuing remicade at least for initial 3 months on cosentyx.  High risk medication use  Already has recent labs checked last month for medication monitoring on rinvoq including CBC, CMP and last  TB test in October was negative. Discussed risks of cosentyx including injection site reactions, allergic reaction, infections, and long term medication monitoring.  Orders: No orders of the defined types were placed in this encounter.  No orders of the defined types were placed in this encounter.    Follow-Up Instructions: No follow-ups on file.   Fuller Plan, MD  Note - This record has been created using [employer].  Chart creation errors have been sought, but may not always  have been located. Such creation errors do not reflect on  the standard of medical care.

## 2022-04-17 ENCOUNTER — Telehealth: Payer: Self-pay | Admitting: Pharmacist

## 2022-04-17 DIAGNOSIS — Z79899 Other long term (current) drug therapy: Secondary | ICD-10-CM

## 2022-04-17 DIAGNOSIS — M0880 Other juvenile arthritis, unspecified site: Secondary | ICD-10-CM

## 2022-04-17 DIAGNOSIS — M0600 Rheumatoid arthritis without rheumatoid factor, unspecified site: Secondary | ICD-10-CM

## 2022-04-17 DIAGNOSIS — L732 Hidradenitis suppurativa: Secondary | ICD-10-CM

## 2022-04-17 NOTE — Telephone Encounter (Signed)
Started Prior Authorization request through [facility] for Angie Stone via CoverMyMeds.   Awaiting completed chart note  Key: [id number]

## 2022-04-17 NOTE — Telephone Encounter (Addendum)
Please start COSENTYX SQ BIV pending OV note from 04/16/2022  Dose: 300mg  SQ at Week 0, 1, 2, 3, 4 then 300mg  SQ every 4 weeks thereafter  Dx: HS (primary), [name]  Previously tried therapies: Rinvoq - continuing x 3 months (but do not include in PA as we will likely provide her with 3 month supply of samples) Remicade - discontinuing MTX oral + Actemra IV - dicsontinued 09/2012 Orencia IV + Kineret [place] + MTX oral - 09/2012 through 06/2013 then stopped d/t moving from [place] to [place]; restarted 05/2014 through 01/16/2015 MTX injections: 01/2014 through 01/24/17 - stopped d/t ineffeciacy to control disease and side effects Remicade started 01/2015 through 03/04/2019 and restarted 06/15/19 Sulfasalazine (01/24/2017 through 02/25/17, stopped due to rash. Restarted 03/21/17 through 04/04/17 and stopped again due to rash reappearance) Leflunomide 05/16/17 thriygh 01/06/18 - stopped due to lack of effectiveness nad clinical improvement Xeljanz started 01/08/18 at 11mg  daily nd reduced to 5 mg by Dr. Benjamine Mola on 12/22 Chronic oral steroid use since 12/2017   Knox Saliva, PharmD, MPH, BCPS, CPP Clinical Pharmacist (Rheumatology and Pulmonology)  ----- Message from Shona Needles, RT sent at 04/16/2022  4:31 PM EST ----- Regarding: NEW START COSENTYX

## 2022-04-17 NOTE — Telephone Encounter (Signed)
PA has been submitted.

## 2022-04-18 ENCOUNTER — Other Ambulatory Visit (HOSPITAL_COMMUNITY): Payer: Self-pay

## 2022-04-18 NOTE — Telephone Encounter (Signed)
Received notification from [facility] regarding a prior authorization for [place]. Authorization has been APPROVED from 04/17/22 to 04/18/23.   Per test claim, copay for #8 for 28 days supply is $0.00 with DUR codes- TD and HD  Authorization # [id number]

## 2022-04-21 ENCOUNTER — Encounter: Payer: Self-pay | Admitting: Dermatology

## 2022-04-21 DIAGNOSIS — L732 Hidradenitis suppurativa: Secondary | ICD-10-CM

## 2022-04-22 ENCOUNTER — Other Ambulatory Visit (HOSPITAL_COMMUNITY): Payer: Self-pay

## 2022-04-22 ENCOUNTER — Other Ambulatory Visit: Payer: Self-pay

## 2022-04-22 MED ORDER — MUPIROCIN 2 % EX OINT: TOPICAL_OINTMENT | CUTANEOUS | 3 refills | Status: DC

## 2022-04-22 MED ORDER — COSENTYX SENSOREADY (300 MG) 150 MG/ML ~~LOC~~ SOAJ
SUBCUTANEOUS | 0 refills | Status: DC
  Filled 2022-04-22: qty 8, fill #0
  Filled 2022-04-22: qty 8, 28d supply, fill #0

## 2022-04-22 NOTE — Telephone Encounter (Signed)
Delivery instructions have been updated in [place], medication will be couriered to [facility] at earliest convenience.  Rx has been processed in [facility] and the patient has no copay at this time.

## 2022-04-22 NOTE — Telephone Encounter (Signed)
Per test claim for Rinvoq, claim does successfully process so PA for both Cosentyx and Rinvoq appear to be active.  Rx for Cosentyx sent to [facility] to be couriered to clinic prior to Cosentyx new start appt on 05/23/2022.  Patient will stay on Rinvoq x 3 months after starting Cosentyx  Knox Saliva, PharmD, MPH, BCPS, CPP Clinical Pharmacist (Rheumatology and Pulmonology)

## 2022-04-23 ENCOUNTER — Other Ambulatory Visit: Payer: Self-pay

## 2022-04-23 DIAGNOSIS — L732 Hidradenitis suppurativa: Secondary | ICD-10-CM

## 2022-04-23 NOTE — Progress Notes (Signed)
Opened in error

## 2022-04-24 MED ORDER — "GAUZE PADS 2""X2"" PADS": MEDICATED_PAD | 3 refills | Status: DC

## 2022-04-24 MED ORDER — "ADHESIVE 1""X6YD TAPE": MEDICATED_TAPE | 3 refills | Status: DC

## 2022-04-24 MED ORDER — "GAUZE PADS 3""X3"" PADS": MEDICATED_PAD | 3 refills | Status: DC

## 2022-04-24 MED ORDER — "NEXCARE COBAN WRAP 3""X5YD MISC": 3 refills | Status: DC

## 2022-04-24 MED ORDER — "GAUZE DRESSING 4""X4"" PADS": MEDICATED_PAD | 3 refills | Status: DC

## 2022-04-24 NOTE — Addendum Note (Signed)
Addended by: Rudell Cobb A on: 04/24/2022 08:30 AM   Modules accepted: Orders

## 2022-04-25 ENCOUNTER — Other Ambulatory Visit: Payer: Self-pay

## 2022-04-25 ENCOUNTER — Other Ambulatory Visit: Payer: Self-pay | Admitting: Internal Medicine

## 2022-04-25 DIAGNOSIS — M0880 Other juvenile arthritis, unspecified site: Secondary | ICD-10-CM

## 2022-04-25 DIAGNOSIS — L732 Hidradenitis suppurativa: Secondary | ICD-10-CM

## 2022-04-25 NOTE — Telephone Encounter (Signed)
Next Visit: 06/19/2022  Last Visit: 04/16/2022  Last Fill: 01/23/2022  QI:[id number] rheumatoid arthritis   Current Dose per office note on 04/16/2022: Recommend keeping her on rinvoq concurrently to reduce risk of flare up when discontinuing remicade at least for initial 3 months on cosentyx.   Labs: 04/11/2022 Lab result shows a small increase in platelet count this may be related to active inflammation. We are planning to discuss possible treatment change at upcoming appointment already that might work better.   TB Gold: 01/08/2022 negative    Okay to refill rinvoq?

## 2022-04-26 ENCOUNTER — Other Ambulatory Visit: Payer: Self-pay

## 2022-04-26 DIAGNOSIS — L732 Hidradenitis suppurativa: Secondary | ICD-10-CM

## 2022-04-26 NOTE — Telephone Encounter (Signed)
[place]: [phone]  but had to leave a message with the wound supply team to return our call. aw

## 2022-04-26 NOTE — Progress Notes (Signed)
ERROR

## 2022-04-29 ENCOUNTER — Other Ambulatory Visit: Payer: Self-pay

## 2022-04-29 DIAGNOSIS — L732 Hidradenitis suppurativa: Secondary | ICD-10-CM

## 2022-04-29 MED ORDER — "ADHESIVE 1""X6YD TAPE": MEDICATED_TAPE | 3 refills | Status: DC

## 2022-04-29 MED ORDER — "GAUZE DRESSING 4""X4"" PADS": MEDICATED_PAD | 3 refills | Status: DC

## 2022-04-29 MED ORDER — "GAUZE PADS 2""X2"" PADS": MEDICATED_PAD | 3 refills | Status: DC

## 2022-04-29 MED ORDER — "NEXCARE COBAN WRAP 3""X5YD MISC": 3 refills | Status: DC

## 2022-04-29 MED ORDER — "GAUZE PADS 3""X3"" PADS": MEDICATED_PAD | 3 refills | Status: DC

## 2022-04-29 NOTE — Progress Notes (Signed)
Orders printed to be faxed to [facility] per patient's MyChart request. Demographics, progress notes and wound supplies sent to [phone]. Phone number for any questions [phone] opt 4. aw

## 2022-04-30 ENCOUNTER — Other Ambulatory Visit (HOSPITAL_COMMUNITY): Payer: Self-pay

## 2022-05-01 ENCOUNTER — Other Ambulatory Visit: Payer: Self-pay

## 2022-05-01 ENCOUNTER — Other Ambulatory Visit (HOSPITAL_COMMUNITY): Payer: Self-pay

## 2022-05-02 ENCOUNTER — Other Ambulatory Visit (HOSPITAL_COMMUNITY): Payer: Self-pay

## 2022-05-05 ENCOUNTER — Encounter: Payer: Self-pay | Admitting: Dermatology

## 2022-05-07 ENCOUNTER — Other Ambulatory Visit (HOSPITAL_COMMUNITY): Payer: Self-pay

## 2022-05-09 ENCOUNTER — Encounter: Payer: Self-pay | Admitting: Dermatology

## 2022-05-09 ENCOUNTER — Ambulatory Visit (INDEPENDENT_AMBULATORY_CARE_PROVIDER_SITE_OTHER): Payer: Medicaid Other | Admitting: Dermatology

## 2022-05-09 VITALS — BP 110/69 | HR 78

## 2022-05-09 DIAGNOSIS — M0600 Rheumatoid arthritis without rheumatoid factor, unspecified site: Secondary | ICD-10-CM | POA: Diagnosis not present

## 2022-05-09 DIAGNOSIS — L732 Hidradenitis suppurativa: Secondary | ICD-10-CM | POA: Diagnosis not present

## 2022-05-09 DIAGNOSIS — Z79899 Other long term (current) drug therapy: Secondary | ICD-10-CM | POA: Diagnosis not present

## 2022-05-09 NOTE — Progress Notes (Signed)
   Follow-Up Visit   Subjective  Angie Stone is a 22 y.o. female who presents for the following: hidradenitis suppurativa (Hx of hs at groin, under arms, and under breasts. Currently flared. Been seen by rheumatologist and prescribed cosentyx. Plan to start 22 of February. ).  The following portions of the chart were reviewed this encounter and updated as appropriate:  Tobacco  Allergies  Meds  Problems  Med Hx  Surg Hx  Fam Hx     Review of Systems: No other skin or systemic complaints except as noted in HPI or Assessment and Plan.  Objective  Well appearing patient in no apparent distress; mood and affect are within normal limits.  A focused examination was performed including upper extremities, including the arms, hands, fingers, and fingernails. Relevant physical exam findings are noted in the Assessment and Plan.  b/l axilla , groin, inframammary Acutely draining areas under b/l axilla   Assessment & Plan  Hidradenitis suppurativa - severe Not doing well on Isotretinoin - Discontinued b/l axilla , groin, inframammary Chronic and persistent condition with duration or expected duration over one year. Condition is bothersome/symptomatic for patient. Currently flared.  Patient d/c Isotretinoin due to increased painful flares Seen by Rheumatologist  Patient will start on Cosentyx by rheumatologist on Feb 22 - Cosentyx should help Hidradenitis and arthritis. Will continue rinvoq Will d/c remicade during first 3 months of cosentyx   Faxed forms for bandages for patient   (Humira is also FDA approved for hidradenitis, but if she is not improving on Remicade, I would not think she would improve on Humira, another TNF alpha inhibitor.  ).  Recommend consideration for starting Cosentyx but will need to consult with her rheumatologist as she is on Remicade every 6 weeks for her arthritis. May consider switching from Remicade to Cosentyx which may help hidradenitis as well as  rheumatoid arthritis, both.   Related Medications Secukinumab, 300 MG Dose, (COSENTYX SENSOREADY, 300 MG,) 150 MG/ML SOAJ Inject 312m into the skin at Weeks 0, 1, 2, 4. [place] to [place]um: [address] [id number] Appt on 05/23/2022  mupirocin ointment (BACTROBAN) 2 % Apply to healing wounds QD.  Adhesive Tape (ADHESIVE 1"X6YD) TAPE Use to secure bandage to cover wounds.  Elastic Bandages & Supports (NEXCARE COBAN WRAP 3"X5YD) MISC Clean wounds daily with warm water and soap. Pat dry, apply Mupirocin 2% ointment and cover with bandage. May use wrap to secure bandage when needed.  Gauze Pads & Dressings (GAUZE DRESSING) 4"X4" PADS Clean wounds daily with warm water and soap. Pat dry, apply Mupirocin 2% ointment and cover with bandage.  Gauze Pads & Dressings (GAUZE PADS 2"X2") 2"X2" PADS Clean wounds daily with warm water and soap. Pat dry, apply Mupirocin 2% ointment and cover with bandage.  Gauze Pads & Dressings (GAUZE PADS 3"X3") 3"X3" PADS Clean wounds daily with warm water and soap. Pat dry, apply Mupirocin 2% ointment and cover with bandage.  Seronegative Arthritis Follow up with Rheumatology.  Return in about 16 weeks (around 08/29/2022) for end of May HS follow up.  I,Ruthell Rummage CMA, am acting as scribe for DSarina Ser MD. Documentation: I have reviewed the above documentation for accuracy and completeness, and I agree with the above.  DSarina Ser MD

## 2022-05-09 NOTE — Patient Instructions (Addendum)
Due to recent changes in healthcare laws, you may see results of your pathology and/or laboratory studies on MyChart before the doctors have had a chance to review them. We understand that in some cases there may be results that are confusing or concerning to you. Please understand that not all results are received at the same time and often the doctors may need to interpret multiple results in order to provide you with the best plan of care or course of treatment. Therefore, we ask that you please give us 2 business days to thoroughly review all your results before contacting the office for clarification. Should we see a critical lab result, you will be contacted sooner.   If You Need Anything After Your Visit  If you have any questions or concerns for your doctor, please call our main line at [phone] and press option 4 to reach your doctor's medical assistant. If no one answers, please leave a voicemail as directed and we will return your call as soon as possible. Messages left after 4 pm will be answered the following business day.   You may also send us a message via MyChart. We typically respond to MyChart messages within 1-2 business days.  For prescription refills, please ask your pharmacy to contact our office. Our fax number is [phone].  If you have an urgent issue when the clinic is closed that cannot wait until the next business day, you can page your doctor at the number below.    Please note that while we do our best to be available for urgent issues outside of office hours, we are not available 24/7.   If you have an urgent issue and are unable to reach us, you may choose to seek medical care at your doctor's office, retail clinic, urgent care center, or emergency room.  If you have a medical emergency, please immediately call 911 or go to the emergency department.  Pager Numbers  - Dr. Kowalski: [phone]  - Dr. Moye: [phone]  - Dr. Stewart:  [phone]  In the event of inclement weather, please call our main line at [phone] for an update on the status of any delays or closures.  Dermatology Medication Tips: Please keep the boxes that topical medications come in in order to help keep track of the instructions about where and how to use these. Pharmacies typically print the medication instructions only on the boxes and not directly on the medication tubes.   If your medication is too expensive, please contact our office at [phone] option 4 or send us a message through MyChart.   We are unable to tell what your co-pay for medications will be in advance as this is different depending on your insurance coverage. However, we may be able to find a substitute medication at lower cost or fill out paperwork to get insurance to cover a needed medication.   If a prior authorization is required to get your medication covered by your insurance company, please allow us 1-2 business days to complete this process.  Drug prices often vary depending on where the prescription is filled and some pharmacies may offer cheaper prices.  The website www.goodrx.com contains coupons for medications through different pharmacies. The prices here do not account for what the cost may be with help from insurance (it may be cheaper with your insurance), but the website can give you the price if you did not use any insurance.  - You can print the associated coupon and take it with   your prescription to the pharmacy.  - You may also stop by our office during regular business hours and pick up a GoodRx coupon card.  - If you need your prescription sent electronically to a different pharmacy, notify our office through [facility] MyChart or by phone at [phone] option 4.     Si Usted Necesita Algo Despus de Su Visita  Tambin puede enviarnos un mensaje a travs de MyChart. Por lo general respondemos a los mensajes de MyChart en el transcurso de 1 a 2  das hbiles.  Para renovar recetas, por favor pida a su farmacia que se ponga en contacto con nuestra oficina. Nuestro nmero de fax es el [phone].  Si tiene un asunto urgente cuando la clnica est cerrada y que no puede esperar hasta el siguiente da hbil, puede llamar/localizar a su doctor(a) al nmero que aparece a continuacin.   Por favor, tenga en cuenta que aunque hacemos todo lo posible para estar disponibles para asuntos urgentes fuera del horario de oficina, no estamos disponibles las 24 horas del da, los 7 das de la semana.   Si tiene un problema urgente y no puede comunicarse con nosotros, puede optar por buscar atencin mdica  en el consultorio de su doctor(a), en una clnica privada, en un centro de atencin urgente o en una sala de emergencias.  Si tiene una emergencia mdica, por favor llame inmediatamente al 911 o vaya a la sala de emergencias.  Nmeros de bper  - Dr. Kowalski: [phone]  - Dra. Moye: [phone]  - Dra. Stewart: [phone]  En caso de inclemencias del tiempo, por favor llame a nuestra lnea principal al [phone] para una actualizacin sobre el estado de cualquier retraso o cierre.  Consejos para la medicacin en dermatologa: Por favor, guarde las cajas en las que vienen los medicamentos de uso tpico para ayudarle a seguir las instrucciones sobre dnde y cmo usarlos. Las farmacias generalmente imprimen las instrucciones del medicamento slo en las cajas y no directamente en los tubos del medicamento.   Si su medicamento es muy caro, por favor, pngase en contacto con nuestra oficina llamando al [phone] y presione la opcin 4 o envenos un mensaje a travs de MyChart.   No podemos decirle cul ser su copago por los medicamentos por adelantado ya que esto es diferente dependiendo de la cobertura de su seguro. Sin embargo, es posible que podamos encontrar un medicamento sustituto a menor costo o llenar un formulario para que el  seguro cubra el medicamento que se considera necesario.   Si se requiere una autorizacin previa para que su compaa de seguros cubra su medicamento, por favor permtanos de 1 a 2 das hbiles para completar este proceso.  Los precios de los medicamentos varan con frecuencia dependiendo del lugar de dnde se surte la receta y alguna farmacias pueden ofrecer precios ms baratos.  El sitio web www.goodrx.com tiene cupones para medicamentos de diferentes farmacias. Los precios aqu no tienen en cuenta lo que podra costar con la ayuda del seguro (puede ser ms barato con su seguro), pero el sitio web puede darle el precio si no utiliz ningn seguro.  - Puede imprimir el cupn correspondiente y llevarlo con su receta a la farmacia.  - Tambin puede pasar por nuestra oficina durante el horario de atencin regular y recoger una tarjeta de cupones de GoodRx.  - Si necesita que su receta se enve electrnicamente a una farmacia diferente, informe a nuestra oficina a travs de MyChart de [facility]   o por telfono llamando al [phone] y presione la opcin 4.  

## 2022-05-16 ENCOUNTER — Other Ambulatory Visit (HOSPITAL_COMMUNITY): Payer: Self-pay

## 2022-05-17 ENCOUNTER — Other Ambulatory Visit (HOSPITAL_COMMUNITY): Payer: Self-pay

## 2022-05-21 ENCOUNTER — Other Ambulatory Visit (HOSPITAL_COMMUNITY): Payer: Self-pay

## 2022-05-21 ENCOUNTER — Encounter: Payer: Self-pay | Admitting: Dermatology

## 2022-05-23 ENCOUNTER — Telehealth: Payer: Self-pay | Admitting: Pharmacist

## 2022-05-23 ENCOUNTER — Other Ambulatory Visit: Payer: Self-pay

## 2022-05-23 ENCOUNTER — Ambulatory Visit: Payer: Medicaid Other | Attending: Internal Medicine | Admitting: Pharmacist

## 2022-05-23 ENCOUNTER — Other Ambulatory Visit (HOSPITAL_COMMUNITY): Payer: Self-pay

## 2022-05-23 ENCOUNTER — Encounter (HOSPITAL_COMMUNITY): Payer: Medicaid Other

## 2022-05-23 DIAGNOSIS — M0880 Other juvenile arthritis, unspecified site: Secondary | ICD-10-CM

## 2022-05-23 DIAGNOSIS — Z79899 Other long term (current) drug therapy: Secondary | ICD-10-CM

## 2022-05-23 DIAGNOSIS — M0600 Rheumatoid arthritis without rheumatoid factor, unspecified site: Secondary | ICD-10-CM

## 2022-05-23 DIAGNOSIS — Z96643 Presence of artificial hip joint, bilateral: Secondary | ICD-10-CM

## 2022-05-23 DIAGNOSIS — L732 Hidradenitis suppurativa: Secondary | ICD-10-CM

## 2022-05-23 DIAGNOSIS — Z7189 Other specified counseling: Secondary | ICD-10-CM

## 2022-05-23 MED ORDER — COSENTYX SENSOREADY (300 MG) 150 MG/ML ~~LOC~~ SOAJ
SUBCUTANEOUS | 2 refills | Status: DC
  Filled 2022-05-23: qty 2, 28d supply, fill #0
  Filled 2022-07-09: qty 2, 28d supply, fill #1
  Filled 2022-08-06: qty 2, 28d supply, fill #2

## 2022-05-23 NOTE — Progress Notes (Signed)
Pharmacy Note  Subjective:   Angie Stone presents to clinic today to receive first dose of COSENTYX SQ. Patient currently takes Rinvoq 66m daily which she will be continuing. Last Remicade infusion was 04/11/2022. Dr. KNehemiah Massedand Dr. 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 She was having minimal benefit from Remicade.  She is joined later by her mother, Angie Stone still in the process of re-applying for disability  Patient running a fever or have signs/symptoms of infection? No  Patient currently on antibiotics for the treatment of infection? No  Patient have any upcoming invasive procedures/surgeries? No  Objective: CMP     Component Value Date/Time   NA 137 04/11/2022 1019   K 3.8 04/11/2022 1019   CL 107 04/11/2022 1019   CO2 21 (L) 04/11/2022 1019   GLUCOSE 90 04/11/2022 1019   BUN 7 04/11/2022 1019   CREATININE 0.80 04/11/2022 1019   CREATININE 0.69 03/14/2022 1121   CALCIUM 9.1 04/11/2022 1019   PROT 7.9 04/11/2022 1019   ALBUMIN 3.4 (L) 04/11/2022 1019   AST 20 04/11/2022 1019   ALT 18 04/11/2022 1019   ALKPHOS 69 04/11/2022 1019   BILITOT 0.4 04/11/2022 1019   GFRNONAA >60 04/11/2022 1019   GFRNONAA 124 09/13/2020 1119   GFRAA 144 09/13/2020 1119    CBC    Component Value Date/Time   WBC 10.4 04/11/2022 1019   RBC 3.86 (L) 04/11/2022 1019   HGB 11.1 (L) 04/11/2022 1019   HCT 34.7 (L) 04/11/2022 1019   PLT 427 (H) 04/11/2022 1019   MCV 89.9 04/11/2022 1019   MCH 28.8 04/11/2022 1019   MCHC 32.0 04/11/2022 1019   RDW 14.1 04/11/2022 1019   LYMPHSABS 3.4 04/11/2022 1019   MONOABS 1.4 (H) 04/11/2022 1019   EOSABS 0.1 04/11/2022 1019   BASOSABS 0.1 04/11/2022 1019    Baseline Immunosuppressant Therapy Labs TB GOLD    Latest Ref Rng & Units 01/08/2022    9:46 AM  Quantiferon TB Gold  Quantiferon TB Gold Plus Negative Negative    Hepatitis Panel    Latest Ref Rng & Units 03/28/2020   12:00 AM   Hepatitis  Hep C Ab NON-REACTI NON-REACTIVE    HIV Lab Results  Component Value Date   HIV NON-REACTIVE 03/28/2020   Immunoglobulins    Latest Ref Rng & Units 03/06/2020    2:27 PM  Immunoglobulin Electrophoresis  IgA  47 - 310 mg/dL 187   IgG 600 - 1,640 mg/dL 1,493   IgM 50 - 300 mg/dL 186    SPEP    Latest Ref Rng & Units 04/11/2022   10:19 AM  Serum Protein Electrophoresis  Total Protein 6.5 - 8.1 g/dL 7.9    Chest x-ray: 01/01/2009 - No active disease  Assessment/Plan:  Reviewed importance of holding COSENTYX with signs/symptoms of an infections, if antibiotics are prescribed to treat an active infection, and with invasive procedures  Demonstrated proper injection technique with COSENTYX PEN demo device  Patient able to demonstrate proper injection technique using the teach back method.  Patient self injected in the right upper thigh and left upper thigh with:  [facility]-supplied Medication: COSENTYX 1568mml Sensoredy Pen x 2 pens = total dose of 30040mDC: [id number]: 09/2023 Expiration: [id number] tolerated well.  Observed for 30 mins in office for adverse reaction and none noted.   Patient is to return in 1 month for labs and 6-8 weeks for follow-up appointment.  Standing orders placed.   COSENTYX approved through insurance .   Rx sent to: [facility]: [phone] .  Patient provided with pharmacy phone number and advised to call later this week to schedule shipment to home.  Patient will continue COSENTYX 350m SQ at Week 0 (completed dose today in clinic), then 3020mSQ at Weeks 1, 2, 3, 4 then 30027mQ every 4 weeks thereafter. She will continue in combination with Rinvoq 39m69mce daily  All questions encouraged and answered.  Instructed patient to call with any further questions or concerns.  DevkKnox SalivaarmD, MPH, BCPS, CPP Clinical Pharmacist (Rheumatology and Pulmonology)  05/23/2022 9:44 AM

## 2022-05-23 NOTE — Telephone Encounter (Signed)
Patient had Cosentyx new start visit. Her and her mother requested assistance on disability renewal. I did advise at that visit that Dr. Benjamine Mola makes assessment from rheumatology perspective. I spoke with Dr. Benjamine Mola - he states his assessment is accurate. He recommends occupational therapy referral  I spoke with patient and she is in agreement. Please place referral to occupational therapy and if anything further is needed from patient , can reach out to pt or her mother.  Knox Saliva, PharmD, MPH, BCPS, CPP Clinical Pharmacist (Rheumatology and Pulmonology)

## 2022-05-23 NOTE — Patient Instructions (Addendum)
Your next COSENTYX dose is due on 05/30/22, 06/06/22, 06/13/22, 06/20/22, then every 4 weeks thereafter (starting on 07/18/2022)  CONTINUE RINVOQ 94m daily  HOLD COSENTYX and RINVOQ if you have signs or symptoms of an infection. You can resume once you feel better or back to your baseline. HOLD COSENTYX and RINVOQ if you start antibiotics to treat an infection. HOLD COSENTYX and RINVOQ around the time of surgery/procedures. Your surgeon will be able to provide recommendations on when to hold BEFORE and when you are cleared to [place]  Pharmacy information: Your prescription [facility] be shipped from [facility] Their phone number is [phone] will call to schedule shipment and confirm address. They will mail your medication to your home.  Labs are due in 1 month then every 3 months. Lab hours are from Monday to Thursday 8am-12:30pm and 1pm-5pm and Friday 8am-12pm. You do not need an appointment if you come for labs during these times.  How to manage an injection site reaction: Remember the 5 C's: COUNTER - leave on the counter at least 30 minutes but up to overnight to bring medication to room temperature. This may help prevent stinging COLD - place something cold (like an ice gel pack or cold water bottle) on the injection site just before cleansing with alcohol. This may help reduce pain CLARITIN - use Claritin (generic name is loratadine) for the first two weeks of treatment or the day of, the day before, and the day after injecting. This will help to minimize injection site reactions CORTISONE CREAM - apply if injection site is irritated and itching CALL ME - if injection site reaction is bigger than the size of your fist, looks infected, blisters, or if you develop hives

## 2022-05-27 NOTE — Telephone Encounter (Signed)
Referral placed.

## 2022-05-27 NOTE — Telephone Encounter (Signed)
Referral to OT for functional capacity exam associated with rheumatoid arthritis and bilateral hip replacements.

## 2022-06-03 ENCOUNTER — Other Ambulatory Visit: Payer: Self-pay

## 2022-06-03 ENCOUNTER — Encounter: Payer: Self-pay | Admitting: Dermatology

## 2022-06-03 DIAGNOSIS — L732 Hidradenitis suppurativa: Secondary | ICD-10-CM

## 2022-06-03 MED ORDER — MUPIROCIN 2 % EX OINT: TOPICAL_OINTMENT | CUTANEOUS | 3 refills | Status: DC

## 2022-06-04 ENCOUNTER — Other Ambulatory Visit (HOSPITAL_COMMUNITY): Payer: Self-pay

## 2022-06-06 ENCOUNTER — Encounter: Payer: Self-pay | Admitting: Internal Medicine

## 2022-06-19 ENCOUNTER — Ambulatory Visit: Payer: Medicaid Other | Admitting: Internal Medicine

## 2022-06-21 ENCOUNTER — Encounter (HOSPITAL_COMMUNITY): Payer: Self-pay

## 2022-06-21 ENCOUNTER — Emergency Department (HOSPITAL_COMMUNITY)
Admission: EM | Admit: 2022-06-21 | Discharge: 2022-06-21 | Disposition: A | Discharge status: Home / Self Care | Payer: Medicaid Other | Attending: Emergency Medicine | Admitting: Emergency Medicine

## 2022-06-21 ENCOUNTER — Emergency Department (HOSPITAL_COMMUNITY): Payer: Medicaid Other

## 2022-06-21 DIAGNOSIS — L0211 Cutaneous abscess of neck: Secondary | ICD-10-CM | POA: Insufficient documentation

## 2022-06-21 DIAGNOSIS — L0291 Cutaneous abscess, unspecified: Secondary | ICD-10-CM

## 2022-06-21 DIAGNOSIS — R000 Tachycardia, unspecified: Secondary | ICD-10-CM | POA: Diagnosis not present

## 2022-06-21 LAB — CBC WITH DIFFERENTIAL/PLATELET
Abs Immature Granulocytes: 0.06 10*3/uL (ref 0.00–0.07)
Basophils Absolute: 0.1 10*3/uL (ref 0.0–0.1)
Basophils Relative: 1 %
Eosinophils Absolute: 0.2 10*3/uL (ref 0.0–0.5)
Eosinophils Relative: 2 %
HCT: 34.4 % — ABNORMAL LOW (ref 36.0–46.0)
Hemoglobin: 11 g/dL — ABNORMAL LOW (ref 12.0–15.0)
Immature Granulocytes: 1 %
Lymphocytes Relative: 25 %
Lymphs Abs: 2.4 10*3/uL (ref 0.7–4.0)
MCH: 28.2 pg (ref 26.0–34.0)
MCHC: 32 g/dL (ref 30.0–36.0)
MCV: 88.2 fL (ref 80.0–100.0)
Monocytes Absolute: 1.6 10*3/uL — ABNORMAL HIGH (ref 0.1–1.0)
Monocytes Relative: 17 %
Neutro Abs: 5.3 10*3/uL (ref 1.7–7.7)
Neutrophils Relative %: 54 %
Platelets: 409 10*3/uL — ABNORMAL HIGH (ref 150–400)
RBC: 3.9 MIL/uL (ref 3.87–5.11)
RDW: 14.1 % (ref 11.5–15.5)
WBC: 9.6 10*3/uL (ref 4.0–10.5)
nRBC: 0 % (ref 0.0–0.2)

## 2022-06-21 LAB — BASIC METABOLIC PANEL
Anion gap: 9 (ref 5–15)
BUN: 9 mg/dL (ref 6–20)
CO2: 22 mmol/L (ref 22–32)
Calcium: 8.7 mg/dL — ABNORMAL LOW (ref 8.9–10.3)
Chloride: 104 mmol/L (ref 98–111)
Creatinine, Ser: 0.81 mg/dL (ref 0.44–1.00)
GFR, Estimated: >60 mL/min (ref >60)
Glucose, Bld: 106 mg/dL — ABNORMAL HIGH (ref 70–99)
Potassium: 3.5 mmol/L (ref 3.5–5.1)
Sodium: 135 mmol/L (ref 135–145)

## 2022-06-21 MED ORDER — LIDOCAINE HCL (PF) 1 % IJ SOLN
5.0000 mL | Freq: Once | INTRAMUSCULAR | Status: AC
  Administered 2022-06-21: 5 mL
  Filled 2022-06-21: qty 30

## 2022-06-21 MED ORDER — SODIUM CHLORIDE (PF) 0.9 % IJ SOLN
INTRAMUSCULAR | Status: AC
  Filled 2022-06-21: qty 50

## 2022-06-21 MED ORDER — ACETAMINOPHEN 325 MG PO TABS: 650.0000 mg | ORAL_TABLET | Freq: Once | ORAL | Status: DC

## 2022-06-21 MED ORDER — IOHEXOL 300 MG/ML  SOLN
75.0000 mL | Freq: Once | INTRAMUSCULAR | Status: AC | PRN
  Administered 2022-06-21: 75 mL via INTRAVENOUS

## 2022-06-21 NOTE — ED Triage Notes (Signed)
Pt reports that she has an abscess on the back of her neck for the past two weeks with no drainage or fevers.

## 2022-06-21 NOTE — Discharge Instructions (Addendum)
You were seen in the emergency department today for an abscess to the back of your neck.  We were able to drain this in place a small amount of packing.  This should stay in for at least the next 48 hours before being removed.  Please keep the area clean and dry.  Please follow-up with your dermatologist and wound care.  Please return to the emergency department for worsening symptoms like fever, nausea or reaccumulation of the abscess.

## 2022-06-21 NOTE — ED Provider Notes (Signed)
[place] EMERGENCY DEPARTMENT AT [facility] Provider Note   CSN: [id number] Arrival date & time: 06/21/22  [id number]     History  Chief Complaint  Patient presents with   Abscess    Angie Stone is a 22 y.o. female.  With past medical history of juvenile idiopathic arthritis who presents to the emergency department with abscess.  States she has had abscess to the back of the neck for about 2 weeks.  States that they have been slowly growing.  She denies drainage.  She denies having fevers, nausea.  Has been eating and drinking appropriately.  She was seen by primary care for these about 3 days ago was prescribed doxycycline but the mother at bedside states that insurance has not approved the medication yet.  She has a history of hidradenitis suppurativa and multiple other abscesses.    Abscess      Home Medications Prior to Admission medications   Medication Sig Start Date End Date Taking? Authorizing Provider  Adhesive Tape (ADHESIVE 1"X6YD) TAPE Use to secure bandage to cover wounds. 04/29/22   Ralene Bathe, MD  diphenhydrAMINE (BENADRYL) 25 MG tablet Take 1 tablet (25 mg total) by mouth every 6 (six) hours. Patient taking differently: Take 25 mg by mouth. With Remicade infusions 04/13/17   Waynetta Pean, PA-C  Elastic Bandages & Supports [facility] COBAN WRAP 3"X5YD) [place] Clean wounds daily with warm water and soap. Pat dry, apply Mupirocin 2% ointment and cover with bandage. May use wrap to secure bandage when needed. 04/29/22   Ralene Bathe, MD  Gauze Pads & Dressings (GAUZE DRESSING) [phone]" PADS Clean wounds daily with warm water and soap. Pat dry, apply Mupirocin 2% ointment and cover with bandage. 04/29/22   Ralene Bathe, MD  Gauze Pads & Dressings (GAUZE PADS 2"X2") 2"X2" PADS Clean wounds daily with warm water and soap. Pat dry, apply Mupirocin 2% ointment and cover with bandage. 04/29/22   Ralene Bathe, MD  Gauze Pads & Dressings (GAUZE PADS  3"X3") 3"X3" PADS Clean wounds daily with warm water and soap. Pat dry, apply Mupirocin 2% ointment and cover with bandage. 04/29/22   Ralene Bathe, MD  medroxyPROGESTERone (DEPO-PROVERA) 150 MG/ML injection Inject into the muscle every 3 (three) months. 03/14/20   [provider]  mupirocin ointment (BACTROBAN) 2 % Apply to healing wounds QD. 06/03/22   Ralene Bathe, MD  RINVOQ 15 MG TB24 TAKE 1 TABLET BY MOUTH DAILY 04/25/22   Rice, Resa Miner, MD  Secukinumab, 300 MG Dose, (COSENTYX SENSOREADY, 300 MG,) 150 MG/ML SOAJ Inject 300mg  into the skin at Weeks 0, 1, 2, 4. Courier to rheum: [address] [place] [address]. Appt on 05/23/2022 04/22/22   Collier Salina, MD  Secukinumab, 300 MG Dose, (COSENTYX SENSOREADY, 300 MG,) 150 MG/ML SOAJ Inject 300mg  (as two divided injections) into the skin on 06/20/2022 then every 4 weeks thereafter0 06/20/22   Rice, Resa Miner, MD      Allergies    Sulfasalazine    Review of Systems   Review of Systems  Skin:  Positive for wound.  All other systems reviewed and are negative.   Physical Exam Updated Vital Signs BP 123/82   Pulse 81   Temp 98.4 F (36.9 C) (Oral)   Resp 18   SpO2 100%  Physical Exam Vitals and nursing note reviewed.  Constitutional:      General: She is not in acute distress.    Appearance:  Normal appearance. She is obese. She is not ill-appearing or toxic-appearing.  HENT:     Head: Normocephalic.  Eyes:     General: No scleral icterus. Cardiovascular:     Pulses: Normal pulses.  Pulmonary:     Effort: Pulmonary effort is normal. No respiratory distress.  Musculoskeletal:     Cervical back: Neck supple. Tenderness present.  Skin:    General: Skin is warm and dry.     Capillary Refill: Capillary refill takes less than 2 seconds.     Comments: 2 abscesses stacked on top of each other at the midline at the posterior neck.  There is no obvious head to the abscesses.  They are tender to  palpation.  Neurological:     General: No focal deficit present.     Mental Status: She is alert.  Psychiatric:        Mood and Affect: Mood normal.        Behavior: Behavior normal.     ED Results / Procedures / Treatments   Labs (all labs ordered are listed, but only abnormal results are displayed) Labs Reviewed  BASIC METABOLIC PANEL - Abnormal; Notable for the following components:      Result Value   Glucose, Bld 106 (*)    Calcium 8.7 (*)    All other components within normal limits  CBC WITH DIFFERENTIAL/PLATELET - Abnormal; Notable for the following components:   Hemoglobin 11.0 (*)    HCT 34.4 (*)    Platelets 409 (*)    Monocytes Absolute 1.6 (*)    All other components within normal limits    EKG None  Radiology CT Soft Tissue Neck W Contrast  Result Date: 06/21/2022 CLINICAL DATA:  22 year old female with "abscess back of neck for 2 weeks. EXAM: CT NECK WITH CONTRAST TECHNIQUE: Multidetector CT imaging of the neck was performed using the standard protocol following the bolus administration of intravenous contrast. RADIATION DOSE REDUCTION: This exam was performed according to the departmental dose-optimization program which includes automated exposure control, adjustment of the mA and/or kV according to patient size and/or use of iterative reconstruction technique. CONTRAST:  7mL OMNIPAQUE IOHEXOL 300 MG/ML  SOLN COMPARISON:  None Available. FINDINGS: Pharynx and larynx: Negative. Negative parapharyngeal and retropharyngeal spaces. Salivary glands: Negative.  Negative sublingual space. Thyroid: Negative. Lymph nodes: Negative, no abnormal cervical lymph nodes. Vascular: Suboptimal intravascular contrast, but the major vascular structures in the neck and at the skull base appear to remain patent. Limited intracranial: Negative. Visualized orbits: Negative. Mastoids and visualized paranasal sinuses: Clear. Skeleton: No acute osseous abnormality identified. Posterior  midline neck abnormal skin thickening and subdermal/subcutaneous low-density fluid collection with adjacent inflammatory stranding (series 4, image 26 and sagittal image 48). No soft tissue gas. Abnormality extends to the superficial fascial layer. The collection encompasses 31 x 41 x 51 mm (AP by transverse by CC) for an estimated volume of 33 mL. Regional level 5 lymph nodes remain normal. The abnormality extends from the C2 spinous process level to the C5 spinous process level. Upper chest: Negative. IMPRESSION: 1. Posterior midline neck subdermal/subcutaneous low-density fluid collection compatible with Abscess. Regional inflammatory stranding. Volume estimated at 33 mL (up to 5.1 cm diameter). No soft tissue gas or other complicating features. 2. No other acute or inflammatory process identified in the Neck Electronically Signed   By: Genevie Ann M.D.   On: 06/21/2022 10:36    Procedures .Marland KitchenIncision and Drainage  Date/Time: 06/21/2022 11:50 AM  Performed by: Theodis Blaze  E, PA-C Authorized by: Mickie Hillier, PA-C   Consent:    Consent obtained:  Verbal   Consent given by:  Patient   Risks, benefits, and alternatives were discussed: yes     Risks discussed:  Bleeding, incomplete drainage, pain, damage to other organs and infection   Alternatives discussed:  No treatment and delayed treatment Universal protocol:    Procedure explained and questions answered to patient or proxy's satisfaction: yes     Relevant documents present and verified: yes     Test results available : yes     Imaging studies available: yes     Required blood products, implants, devices, and special equipment available: yes     Site/side marked: yes     Immediately prior to procedure, a time out was called: yes     Patient identity confirmed:  Verbally with patient Location:    Type:  Abscess   Size:  6cm   Location:  Neck   Neck location: midline posterior. Pre-procedure details:    Skin preparation:  Antiseptic  wash and chlorhexidine Sedation:    Sedation type:  None Anesthesia:    Anesthesia method:  Local infiltration   Local anesthetic:  Lidocaine 1% w/o epi Procedure type:    Complexity:  Complex Procedure details:    Ultrasound guidance: no     Incision types:  Single straight   Incision depth:  Dermal   Wound management:  Probed and deloculated   Drainage:  Purulent   Drainage amount:  Copious   Wound treatment:  Wound left open   Packing materials:  1/4 in iodoform gauze   Amount 1/4" iodoform:  3 inches Post-procedure details:    Procedure completion:  Tolerated well, no immediate complications    Medications Ordered in ED Medications  acetaminophen (TYLENOL) tablet 650 mg (has no administration in time range)  sodium chloride (PF) 0.9 % injection (has no administration in time range)  iohexol (OMNIPAQUE) 300 MG/ML solution 75 mL (75 mLs Intravenous Contrast Given 06/21/22 1004)  lidocaine (PF) (XYLOCAINE) 1 % injection 5 mL (5 mLs Infiltration Given by Other 06/21/22 1111)    ED Course/ Medical Decision Making/ A&P Clinical Course as of 06/21/22 1153  Fri Jun 21, 2022  1103 CT without further deep space infection. Will I&D.  [LA]    Clinical Course User Index [LA] Mickie Hillier, PA-C    Medical Decision Making Amount and/or Complexity of Data Reviewed Labs: ordered. Radiology: ordered.  Risk OTC drugs. Prescription drug management.  Initial Impression and Ddx 22 year old female who presents to the emergency department with abscess. Patient PMH that increases complexity of ED encounter: Obesity, juvenile idiopathic arthritis, hidradenitis suppurativa Differential: Abscess, cyst, deep space infection, etc.  Interpretation of Diagnostics I independent reviewed and interpreted the labs as followed: CBC without leukocytosis.  Stable anemia.  BMP without electrolyte derangement, AKI.  - I independently visualized the following imaging with scope of interpretation  limited to determining acute life threatening conditions related to emergency care: CT soft tissue of the neck, which revealed  1. Posterior midline neck subdermal/subcutaneous low-density fluid  collection compatible with Abscess. Regional inflammatory stranding.  Volume estimated at 33 mL (up to 5.1 cm diameter). No soft tissue  gas or other complicating features.    2. No other acute or inflammatory process identified in the Neck    Patient Reassessment and Ultimate Disposition/Management 22 year old female who presents to the emergency department with abscess.  She is overall well-appearing.  She  is nontoxic-appearing.  She has no systemic symptoms, however she presents with low-grade fever and mild tachycardia to 106. She has 2 abscesses that are stacked on top of each other at the midline C-spine.  Tender to palpation.  There is no obvious head to these abscesses.  I had Dr. Vanita Panda, ED attending evaluate this as well and we agreed that proceeding with CT soft tissue of the neck is best course of action at this time.  Labs are unremarkable.  CT of the neck shows  1. Posterior midline neck subdermal/subcutaneous low-density fluid  collection compatible with Abscess. Regional inflammatory stranding.  Volume estimated at 33 mL (up to 5.1 cm diameter). No soft tissue  gas or other complicating features.    2. No other acute or inflammatory process identified in the Neck   I formed incision and drainage of the abscess after CT results. Single horizontal incision just to the right of midline. Copious purulent drainage. Probed and deloculated. Packed. She is already prescribed doxycycline which she is working on obtaining. Followed by dermatology and has f/u appointment with wound center [facility] at [facility] next month.   Instructed to return for worsening systemic symptoms, reaccumulation of the abscess. She verbalized understanding.   The patient has been appropriately medically screened and/or  stabilized in the ED. I have low suspicion for any other emergent medical condition which would require further screening, evaluation or treatment in the ED or require inpatient management. At time of discharge the patient is hemodynamically stable and in no acute distress. I have discussed work-up results and diagnosis with patient and answered all questions. Patient is agreeable with discharge plan. We discussed strict return precautions for returning to the emergency department and they verbalized understanding.    Patient management required discussion with the following services or consulting groups:  None  Complexity of Problems Addressed Acute complicated illness or Injury  Additional Data Reviewed and Analyzed Further history obtained from: Further history from spouse/family member, Past medical history and medications listed in the EMR, Prior ED visit notes, Recent Consult notes, and Care Everywhere  Patient Encounter Risk Assessment Minor Procedures and SDOH impact on management  Final Clinical Impression(s) / ED Diagnoses Final diagnoses:  Abscess    Rx / DC Orders ED Discharge Orders     None         Mickie Hillier, PA-C 06/21/22 1153    Carmin Muskrat, MD 06/21/22 1216

## 2022-06-21 NOTE — ED Notes (Signed)
An After Visit Summary was printed and given to the patient. Discharge instructions given and no further questions at this time.  Pt leaving with her mother.

## 2022-06-25 ENCOUNTER — Ambulatory Visit (INDEPENDENT_AMBULATORY_CARE_PROVIDER_SITE_OTHER): Payer: Medicaid Other | Admitting: Dermatology

## 2022-06-25 VITALS — BP 150/86 | HR 100

## 2022-06-25 DIAGNOSIS — Z79899 Other long term (current) drug therapy: Secondary | ICD-10-CM | POA: Diagnosis not present

## 2022-06-25 DIAGNOSIS — L732 Hidradenitis suppurativa: Secondary | ICD-10-CM | POA: Diagnosis not present

## 2022-06-25 DIAGNOSIS — Z7189 Other specified counseling: Secondary | ICD-10-CM

## 2022-06-25 MED ORDER — "GAUZE PADS 3""X3"" PADS": MEDICATED_PAD | 3 refills | Status: AC

## 2022-06-25 MED ORDER — DOXYCYCLINE MONOHYDRATE 100 MG PO CAPS: ORAL_CAPSULE | ORAL | 0 refills | Status: DC

## 2022-06-25 MED ORDER — "GAUZE DRESSING 4""X4"" PADS": MEDICATED_PAD | 3 refills | Status: AC

## 2022-06-25 MED ORDER — "GAUZE PADS 2""X2"" PADS": MEDICATED_PAD | 3 refills | Status: AC

## 2022-06-25 NOTE — Patient Instructions (Signed)
Due to recent changes in healthcare laws, you may see results of your pathology and/or laboratory studies on MyChart before the doctors have had a chance to review them. We understand that in some cases there may be results that are confusing or concerning to you. Please understand that not all results are received at the same time and often the doctors may need to interpret multiple results in order to provide you with the best plan of care or course of treatment. Therefore, we ask that you please give us 2 business days to thoroughly review all your results before contacting the office for clarification. Should we see a critical lab result, you will be contacted sooner.   If You Need Anything After Your Visit  If you have any questions or concerns for your doctor, please call our main line at [phone] and press option 4 to reach your doctor's medical assistant. If no one answers, please leave a voicemail as directed and we will return your call as soon as possible. Messages left after 4 pm will be answered the following business day.   You may also send us a message via MyChart. We typically respond to MyChart messages within 1-2 business days.  For prescription refills, please ask your pharmacy to contact our office. Our fax number is [phone].  If you have an urgent issue when the clinic is closed that cannot wait until the next business day, you can page your doctor at the number below.    Please note that while we do our best to be available for urgent issues outside of office hours, we are not available 24/7.   If you have an urgent issue and are unable to reach us, you may choose to seek medical care at your doctor's office, retail clinic, urgent care center, or emergency room.  If you have a medical emergency, please immediately call 911 or go to the emergency department.  Pager Numbers  - Dr. Kowalski: [phone]  - Dr. Moye: [phone]  - Dr. Stewart:  [phone]  In the event of inclement weather, please call our main line at [phone] for an update on the status of any delays or closures.  Dermatology Medication Tips: Please keep the boxes that topical medications come in in order to help keep track of the instructions about where and how to use these. Pharmacies typically print the medication instructions only on the boxes and not directly on the medication tubes.   If your medication is too expensive, please contact our office at [phone] option 4 or send us a message through MyChart.   We are unable to tell what your co-pay for medications will be in advance as this is different depending on your insurance coverage. However, we may be able to find a substitute medication at lower cost or fill out paperwork to get insurance to cover a needed medication.   If a prior authorization is required to get your medication covered by your insurance company, please allow us 1-2 business days to complete this process.  Drug prices often vary depending on where the prescription is filled and some pharmacies may offer cheaper prices.  The website www.goodrx.com contains coupons for medications through different pharmacies. The prices here do not account for what the cost may be with help from insurance (it may be cheaper with your insurance), but the website can give you the price if you did not use any insurance.  - You can print the associated coupon and take it with   your prescription to the pharmacy.  - You may also stop by our office during regular business hours and pick up a GoodRx coupon card.  - If you need your prescription sent electronically to a different pharmacy, notify our office through [facility] MyChart or by phone at [phone] option 4.     Si Usted Necesita Algo Despus de Su Visita  Tambin puede enviarnos un mensaje a travs de MyChart. Por lo general respondemos a los mensajes de MyChart en el transcurso de 1 a 2  das hbiles.  Para renovar recetas, por favor pida a su farmacia que se ponga en contacto con nuestra oficina. Nuestro nmero de fax es el [phone].  Si tiene un asunto urgente cuando la clnica est cerrada y que no puede esperar hasta el siguiente da hbil, puede llamar/localizar a su doctor(a) al nmero que aparece a continuacin.   Por favor, tenga en cuenta que aunque hacemos todo lo posible para estar disponibles para asuntos urgentes fuera del horario de oficina, no estamos disponibles las 24 horas del da, los 7 das de la semana.   Si tiene un problema urgente y no puede comunicarse con nosotros, puede optar por buscar atencin mdica  en el consultorio de su doctor(a), en una clnica privada, en un centro de atencin urgente o en una sala de emergencias.  Si tiene una emergencia mdica, por favor llame inmediatamente al 911 o vaya a la sala de emergencias.  Nmeros de bper  - Dr. Kowalski: [phone]  - Dra. Moye: [phone]  - Dra. Stewart: [phone]  En caso de inclemencias del tiempo, por favor llame a nuestra lnea principal al [phone] para una actualizacin sobre el estado de cualquier retraso o cierre.  Consejos para la medicacin en dermatologa: Por favor, guarde las cajas en las que vienen los medicamentos de uso tpico para ayudarle a seguir las instrucciones sobre dnde y cmo usarlos. Las farmacias generalmente imprimen las instrucciones del medicamento slo en las cajas y no directamente en los tubos del medicamento.   Si su medicamento es muy caro, por favor, pngase en contacto con nuestra oficina llamando al [phone] y presione la opcin 4 o envenos un mensaje a travs de MyChart.   No podemos decirle cul ser su copago por los medicamentos por adelantado ya que esto es diferente dependiendo de la cobertura de su seguro. Sin embargo, es posible que podamos encontrar un medicamento sustituto a menor costo o llenar un formulario para que el  seguro cubra el medicamento que se considera necesario.   Si se requiere una autorizacin previa para que su compaa de seguros cubra su medicamento, por favor permtanos de 1 a 2 das hbiles para completar este proceso.  Los precios de los medicamentos varan con frecuencia dependiendo del lugar de dnde se surte la receta y alguna farmacias pueden ofrecer precios ms baratos.  El sitio web www.goodrx.com tiene cupones para medicamentos de diferentes farmacias. Los precios aqu no tienen en cuenta lo que podra costar con la ayuda del seguro (puede ser ms barato con su seguro), pero el sitio web puede darle el precio si no utiliz ningn seguro.  - Puede imprimir el cupn correspondiente y llevarlo con su receta a la farmacia.  - Tambin puede pasar por nuestra oficina durante el horario de atencin regular y recoger una tarjeta de cupones de GoodRx.  - Si necesita que su receta se enve electrnicamente a una farmacia diferente, informe a nuestra oficina a travs de MyChart de [place]   o por telfono llamando al [phone] y presione la opcin 4.  

## 2022-06-25 NOTE — Progress Notes (Unsigned)
Follow Up Visit   Subjective  Angie Stone is a 22 y.o. female who presents for the following: painful cystic nodules, patient has been on Cosentyx for about a month now and hasn't notice an improvement in HS, and states that currently she is flaring all over. She is still using Rinvoq 15 mg po QD, Mupirocin oint QD, and was recently prescribed Doxycycline 100 mg po BID x 15 days. She is no longer on Remicade infusions for her arthritis.  The following portions of the chart were reviewed this encounter and updated as appropriate: medications, allergies, medical history  Review of Systems:  No other skin or systemic complaints except as noted in HPI or Assessment and Plan.  Objective  Well appearing patient in no apparent distress; mood and affect are within normal limits.  A focused examination was performed of the following areas:  The face, groin, and axilla  Relevant exam findings are noted in the Assessment and Plan.   Assessment & Plan   Hidradenitis suppurativa  Continues to flare - recently had I&D of neck  Pt and mother frustrated.  Pt flared with Isotretinoin, so discontinued. Started Cosentyx recently Currently on oral Doxycycline Takes Rinvoq for Juvenile RA   HIDRADENITIS SUPPURATIVA Exam: numerous draining inflamed nodules of the inframammary, epigastic, and abdominal areas. Largest draining nodule of the R inframammary 1.2 x 2.0 cm, draining nodule on the abdomen measuring 0.5 x 0.2 cm   Chronic and persistent condition with duration or expected duration over one year. Condition is bothersome/symptomatic for patient. Currently flared.  Hidradenitis Suppurativa is a chronic; persistent; non-curable, but treatable condition due to abnormal inflamed sweat glands in the body folds (axilla, inframammary, groin, medial thighs), causing recurrent painful draining cysts and scarring. It can be associated with severe scarring acne and cysts; also abscesses and scarring  of scalp. The goal is control and prevention of flares, as it is not curable. Scars are permanent and can be thickened. Treatment may include daily use of topical medication and oral antibiotics.  Oral isotretinoin may also be helpful.  For some cases, Humira or Cosentyx (biologic injections) may be prescribed to decrease the inflammatory process and prevent flares.  When indicated, inflamed cysts may also be treated surgically.  Treatment Plan:  Continue Doxycycline 100 mg po QD after finishing Doxycycline 100 mg po BID x 15 days by another provider.   Continue Mupirocin 2% ointment to aa's QD.   Continue Cosentyx as prescribed by rheumatologist.   Continue Rinvoq 15 mg po QD.   Multiple draining abscesses / draining sinuses cleansed and dressed today.  Patient did try Isotretinoin but started having more severe flares so she stopped the medication.   Will refer to Dr. Dossie Der at [facility] since patient's condition is so severe, extensive, and not improving with the treatment options we've prescribed. 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   HIDRADENITIS SUPPURATIVA Exam: numerous draining inflamed nodules of the inframammary, epigastic, and abdominal areas. Largest draining nodule of the R inframammary 1.2 x 2.0 cm, draining nodule on the abdomen measuring 0.5 x 0.2 cm   Hidradenitis Suppurativa is a chronic; persistent; non-curable, but treatable condition due to abnormal inflamed sweat glands in the body folds (axilla, inframammary, groin, medial thighs), causing recurrent painful draining cysts and scarring. It can be associated with severe scarring acne and cysts; also abscesses and scarring of scalp. The goal is control and prevention of flares, as it is not curable. Scars are  permanent and can be thickened. Treatment may include daily use of topical medication and oral antibiotics.  Oral isotretinoin may also be  helpful.  For some cases, Humira or Cosentyx (biologic injections) may be prescribed to decrease the inflammatory process and prevent flares.  When indicated, inflamed cysts may also be treated surgically.  Over 45 minutes spent with pt today in evaluation and treatment and Counseling and coordination of care.  Return in about 4 months (around 10/25/2022) for HS follow up .  Luther Redo, CMA, am acting as scribe for Sarina Ser, MD .  Documentation: I have reviewed the above documentation for accuracy and completeness, and I agree with the above.  Sarina Ser, MD

## 2022-06-26 ENCOUNTER — Encounter: Payer: Self-pay | Admitting: Dermatology

## 2022-06-27 DIAGNOSIS — L732 Hidradenitis suppurativa: Principal | ICD-10-CM

## 2022-07-01 NOTE — Progress Notes (Signed)
Office Visit Note  Patient: Angie Stone             Date of Birth: February 16, 2001           MRN: [id number]             PCP: Lucila Maine Referring: Teena Irani, PA-C Visit Date: 07/02/2022   Subjective:  Follow-up   History of Present Illness: Angie Stone is a 22 y.o. female here for follow up for RA with history of [name] and hidradeniitis suppurativa.   Since our last visit she stopped remicade and started with cosentyx injections du to worsening HS disease activity. Continued on rinvoq 15 mg PO daily. No problems with the injections and joints are overall doing okay. Right wrist pain and stiffness increased, lasting several hours each morning. Knees are doing somewhat better. Ankle pain is worse though, also with more pain on the bottom of the foot especially first thing in the morning. Cannot tolerate walking barefoot on wood floors now.  Has difficulty with any activity requiring raising arms above head even has difficulty managing her own wound care and hygiene of hidradenitis lesions in the axillary regions.  Does have some difficulty with changing clothes due to her shoulders.  Skin disease is currently worst part of activity this has progressively worsened now with lesions scattered throughout from her neck down to the groin.  She has never had lesions involving her neck or into her arms more distant than the axillary region until the past few months.  She was restarted on doxycycline but so far not seem to be symptom improvement.  Seeing wound care for the extensive inflammation and multiple draining lesions. She was also recommended to see a specialist at [facility] dermatology.  Previous HPI 04/16/22 Angie Stone is a 22 y.o. female here for follow up for RA with history of [name] and hydradenitis suppurativa on remicade 1000 mg q6wks and rinvoq 15 mg daily. She saw her dermatologist Dr. Gwen Pounds recently and with ongoing HS inflammation recently concerning for decreased  effectiveness of her treatment regimen. The wounds are partially improved today, several open lesions in axillae but no longer raised or draining fluid. Joint pain is slightly worse in the knees she associated with colder weather as there is no increase in swelling.   Previous HPI 03/14/22 Angie Stone is a 22 y.o. female here for follow up for rheumatoid arthritis with history of [name] on remicade infusion 1000 mg q6wks and rinvoq 15 mg daily.  Since her last visit she is doing overall okay but has experienced some increase in left knee pain with small amount of joint swelling.  She is also having ongoing hidradenitis symptoms.  She completed most recently a course of Keflex for associated abscess with improvement.  Currently still has some drainage coming from the lesions under her breast.  Had recent follow-up with dermatology clinic Dr. Gwen Pounds currently on isotretinoin 40 mg daily recommends several more months treatment.   Previous HPI 12/11/21 Angie Stone is a 22 y.o. female here for follow up for inflammatory arthritis with history of [name] on remicade infusion 1000 mg q6wks and rinvoq 15 mg daily.  She has not had any significant flareup of arthritis symptoms since her last visit.  She still has knee pain most commonly at nighttime not associated with significant swelling.  She continues to have a small area of skin numbness over decree sensation on the back of the right leg above the level of the  knee.  She did have worsening hidradenitis lesion in the left axilla that required drainage and had delayed wound healing for about a month.  Before that have been about a year since requiring any aspiration or drainage for a chest.  Otherwise she remains on the low-dose doxycycline treatment.   Previous HPI 07/31/2021 Angie Stone is a 22 y.o. female here for follow up for inflammatory arthritis with history of [name] on remicade infusion 1000 mg q6wks and rinvoq 15 mg daily. Joint symptoms are  mostly doing well she had some days with increased knee pain and stiffness, no visible swelling, warmth, or redness. She notices joint pain is worse in the last 5th or 6th week between remicade infusion. Right axilla with few open sores draining other areas under arms and breasts about the same. She saw dermatology yesterday with recommendation of oral doxycycline and topical cleansing agent for hidradenitis symptoms.   Previous HPI 03/20/21 Angie Stone is a 22 y.o. 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  Symptoms remain well controlled no joint pain swelling or skin disease activity.   Previous HPI 03/06/20 Angie Stone is a 22 y.o. 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 She is looking to transfer care to [facility] from [facility] and [place] due to proximity and transitioning to adult rheumatology clinic. Currently she feels symptoms are very well controlled but she restarting the infliximab infusions round March due to symptoms increased on tofacitinib monotherapy.    She denies eye inflammation or visual acuity change. She does have recurrent skin lesions she describes as boils over the same area on her upper torso or axillary region. She denies cough, dyspnea, lymphadenopathy, oral ulcers, diarrhea, hematochezia, or alopecia.   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 She has no history of uveitis. She underwent bilateral hip arthroplasty in 2015.   Previous treatments: Tocilizumab, methotrexate ~ 2014 Orencia, Kineret, methotrexate 2014-2015, 2016 Remicade, methotrexate  2016-2020 Sulfasalazine 2018-2019 not tolerated due to rash Leflunomide 2019 ineffective Tofacitinib 2019-current Infliximab resumed 05/2019 due to disease flare   Review of  Systems  Constitutional:  Positive for fatigue.  HENT:  Negative for mouth sores and mouth dryness.   Eyes:  Negative for dryness.  Respiratory:  Negative for shortness of breath.   Cardiovascular:  Negative for chest pain and palpitations.  Gastrointestinal:  Negative for blood in stool, constipation and diarrhea.  Endocrine: Negative for increased urination.  Genitourinary:  Negative for involuntary urination.  Musculoskeletal:  Positive for joint pain, joint pain and morning stiffness. Negative for gait problem, joint swelling, myalgias, muscle weakness, muscle tenderness and myalgias.  Skin:  Negative for color change, rash, hair loss and sensitivity to sunlight.  Allergic/Immunologic: Positive for susceptible to infections.  Neurological:  Negative for dizziness and headaches.  Hematological:  Negative for swollen glands.  Psychiatric/Behavioral:  Positive for sleep disturbance. Negative for depressed mood. The patient is not nervous/anxious.     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    Past Medical History:  Diagnosis Date   Immunosuppression    [name] (juvenile idiopathic arthritis)     Family History  Problem Relation Age of Onset   Diabetes Mother    Diabetes Maternal Grandmother    Arthritis Paternal Grandmother    Past Surgical History:  Procedure Laterality Date   JOINT REPLACEMENT Bilateral    Hips   Social History   Social History Narrative   Not on file   Immunization History  Administered Date(s) Administered   Moderna Sars-Covid-2 Vaccination 02/18/2020    PFIZER(Purple Top)SARS-COV-2 Vaccination 02/18/2020, 03/22/2020     Objective: Vital Signs: BP 138/85 (BP Location: Left Arm, Patient Position: Sitting, Cuff Size: Normal)   Pulse (!) 102   Resp 16   Ht 4\' 11"  (1.499 m)   Wt 229 lb (103.9 kg)   BMI 46.25 kg/m    Physical Exam Constitutional:      Appearance: She is obese.  Eyes:     Conjunctiva/sclera: Conjunctivae normal.  Cardiovascular:     Rate and Rhythm: Normal rate and regular rhythm.  Pulmonary:     Effort: Pulmonary effort is normal.     Breath sounds: Normal breath sounds.  Lymphadenopathy:     Cervical: No cervical adenopathy.  Skin:    General: Skin is warm and dry.  Neurological:     Mental Status: She is alert.  Psychiatric:        Mood and Affect: Mood normal.      Musculoskeletal Exam:  Shoulder abduction limited to below horizontal with active movement, better on passive range of motion, tenderness on both sides Elbows full ROM no tenderness or swelling Right wrist tenderness to pressure and at base of 1st CMC without obvious synovitis, left normal Fingers full ROM no tenderness or swelling Hip normal internal and external rotation without pain, no tenderness to lateral hip palpation Knees full ROM no tenderness or swelling Ankles without palpable synovitis there is mild tenderness to pressure posterior and lateral sides, very tender to pressure on plantar side of the foot along anterior edge of calcaneus   CDAI Exam: CDAI Score: 11  Patient Global: 40 mm; Provider Global: 40 mm Swollen: 0 ; Tender: 6  Joint Exam 07/02/2022      Right  Left  Glenohumeral   Tender   Tender  Wrist   Tender     CMC   Tender     Ankle   Tender   Tender     Investigation: No additional findings.  Imaging: CT Soft Tissue Neck W Contrast  Result Date: 06/21/2022 CLINICAL DATA:  22 year old female with "abscess back of neck for 2 weeks. EXAM: CT NECK WITH CONTRAST TECHNIQUE: Multidetector CT imaging of the  neck was performed using the standard protocol following the bolus administration of intravenous contrast. RADIATION DOSE REDUCTION: This exam was performed according to the departmental dose-optimization program which includes automated exposure control, adjustment of the mA and/or kV according to patient size and/or use of iterative reconstruction technique. CONTRAST:  75mL OMNIPAQUE IOHEXOL 300 MG/ML  SOLN COMPARISON:  None Available. FINDINGS: Pharynx and larynx: Negative. Negative parapharyngeal and retropharyngeal spaces. Salivary glands: Negative.  Negative sublingual space. Thyroid: Negative. Lymph nodes: Negative, no abnormal cervical lymph nodes. Vascular: Suboptimal intravascular contrast, but the major vascular structures in the neck and at the skull base appear to remain patent. Limited intracranial: Negative. Visualized orbits: Negative. Mastoids and visualized paranasal sinuses: Clear. Skeleton: No acute osseous  abnormality identified. Posterior midline neck abnormal skin thickening and subdermal/subcutaneous low-density fluid collection with adjacent inflammatory stranding (series 4, image 26 and sagittal image 48). No soft tissue gas. Abnormality extends to the superficial fascial layer. The collection encompasses 31 x 41 x 51 mm (AP by transverse by CC) for an estimated volume of 33 mL. Regional level 5 lymph nodes remain normal. The abnormality extends from the C2 spinous process level to the C5 spinous process level. Upper chest: Negative. IMPRESSION: 1. Posterior midline neck subdermal/subcutaneous low-density fluid collection compatible with Abscess. Regional inflammatory stranding. Volume estimated at 33 mL (up to 5.1 cm diameter). No soft tissue gas or other complicating features. 2. No other acute or inflammatory process identified in the Neck Electronically Signed   By: Odessa Fleming M.D.   On: 06/21/2022 10:36    Recent Labs: Lab Results  Component Value Date   WBC 9.6 06/21/2022   HGB  11.0 (L) 06/21/2022   PLT 409 (H) 06/21/2022   NA 135 06/21/2022   K 3.5 06/21/2022   CL 104 06/21/2022   CO2 22 06/21/2022   GLUCOSE 106 (H) 06/21/2022   BUN 9 06/21/2022   CREATININE 0.81 06/21/2022   BILITOT 0.4 04/11/2022   ALKPHOS 69 04/11/2022   AST 20 04/11/2022   ALT 18 04/11/2022   PROT 7.9 04/11/2022   ALBUMIN 3.4 (L) 04/11/2022   CALCIUM 8.7 (L) 06/21/2022   GFRAA 144 09/13/2020   QFTBGOLDPLUS Negative 01/08/2022    Speciality Comments: Diagnosed in 2010. Combination of tocilizumab IV q2wk and MTX-initiation unclear-d/c 09/2012 Combination Orencia IV q4wk, Kineret sq daily, MTX-7/14-4/15 Tried injectable MTX 30 mg weekly-d/c SE and inefficacy  Remicade infusions 10 mg/kg every 4 weeks Tried SSZ in 2018 and again 2019-d/c both times due to rash Arava-05/16/17-01/06/18-Inadequate response Xeljanz started 01/08/18 Remicade stopped 04/11/22 Cosentyx started 05/23/22  Procedures:  No procedures performed Allergies: Sulfasalazine   Assessment / Plan:     Visit Diagnoses: Seronegative rheumatoid arthritis  Polyarticular juvenile idiopathic arthritis - Plan: Sedimentation rate, C-reactive protein  Inflammatory arthritis appears mildly active there is no objective synovitis on exam today but with multiple tender joints and prolonged stiffness and patient reported swelling symptoms.  Will recheck sed rate and CRP for inflammatory disease activity monitoring.  Not yet into Cosentyx maintenance period.  Continuing Rinvoq 15 mg daily and Cosentyx 300 mg [place] monthly.  Hidradenitis suppurativa  Ongoing skin disease considerably active requiring ongoing wound care follow-up with numerous inflamed and irritated lesions.  So far not under good control with addition of oral doxycycline as well.  Still somewhat early to say whether we will see more effectiveness with needed Cosentyx treatment.  Appreciated management assistance with Dr. Gwen Pounds and would agree for potential to benefit  with seeing specialist at [facility] system.  High risk medication use - Plan: CBC with Differential/Platelet  Checking CBC for medication monitoring continuing on rinvoq and cosentyx.  Had recent metabolic panel that was fine.  Is on doxycycline for abscesses associated with the hidradenitis not infection associated with DMARDs.  Orders: Orders Placed This Encounter  Procedures   Sedimentation rate   C-reactive protein   CBC with Differential/Platelet   No orders of the defined types were placed in this encounter.    Follow-Up Instructions: Return in about 3 months (around 10/01/2022) for RA/HS on COS/UPA f/u 3mos.   Fuller Plan, MD  Note - This record has been created using [employer].  Chart creation errors have been sought, but  may not always  have been located. Such creation errors do not reflect on  the standard of medical care.  

## 2022-07-02 ENCOUNTER — Encounter: Payer: Self-pay | Admitting: Internal Medicine

## 2022-07-02 ENCOUNTER — Ambulatory Visit: Payer: Medicaid Other | Attending: Internal Medicine | Admitting: Internal Medicine

## 2022-07-02 VITALS — BP 138/85 | HR 102 | Resp 16 | Ht 59.0 in | Wt 229.0 lb

## 2022-07-02 DIAGNOSIS — M0600 Rheumatoid arthritis without rheumatoid factor, unspecified site: Secondary | ICD-10-CM

## 2022-07-02 DIAGNOSIS — M0880 Other juvenile arthritis, unspecified site: Secondary | ICD-10-CM | POA: Diagnosis not present

## 2022-07-02 DIAGNOSIS — Z79899 Other long term (current) drug therapy: Secondary | ICD-10-CM

## 2022-07-02 DIAGNOSIS — L732 Hidradenitis suppurativa: Secondary | ICD-10-CM

## 2022-07-03 ENCOUNTER — Telehealth: Payer: Self-pay

## 2022-07-03 LAB — CBC WITH DIFFERENTIAL/PLATELET
Absolute Monocytes: 961 cells/uL — ABNORMAL HIGH (ref 200–950)
Basophils Absolute: 43 cells/uL (ref 0–200)
Basophils Relative: 0.5 %
Eosinophils Absolute: 187 cells/uL (ref 15–500)
Eosinophils Relative: 2.2 %
HCT: 33.9 % — ABNORMAL LOW (ref 35.0–45.0)
Hemoglobin: 11 g/dL — ABNORMAL LOW (ref 11.7–15.5)
Lymphs Abs: 1972 cells/uL (ref 850–3900)
MCH: 27.8 pg (ref 27.0–33.0)
MCHC: 32.4 g/dL (ref 32.0–36.0)
MCV: 85.6 fL (ref 80.0–100.0)
MPV: 9.2 fL (ref 7.5–12.5)
Monocytes Relative: 11.3 %
Neutro Abs: 5338 cells/uL (ref 1500–7800)
Neutrophils Relative %: 62.8 %
Platelets: 389 10*3/uL (ref 140–400)
RBC: 3.96 10*6/uL (ref 3.80–5.10)
RDW: 13.8 % (ref 11.0–15.0)
Total Lymphocyte: 23.2 %
WBC: 8.5 10*3/uL (ref 3.8–10.8)

## 2022-07-03 LAB — SEDIMENTATION RATE: Sed Rate: 123 mm/h — ABNORMAL HIGH (ref 0–20)

## 2022-07-03 LAB — C-REACTIVE PROTEIN: CRP: 49.9 mg/L — ABNORMAL HIGH (ref <8.0)

## 2022-07-03 NOTE — Telephone Encounter (Signed)
[facility] called regarding patient's wound supply order. The wounds need to be debrided before medicaid will cover them. Per Dr. Nehemiah Massed OK to schedule soon because he would only need to see her briefly. Called patient and advised her of information but she declined appt at this time. aw

## 2022-07-07 ENCOUNTER — Encounter: Payer: Self-pay | Admitting: Dermatology

## 2022-07-07 DIAGNOSIS — L732 Hidradenitis suppurativa: Secondary | ICD-10-CM

## 2022-07-08 MED ORDER — MUPIROCIN 2 % EX OINT: TOPICAL_OINTMENT | CUTANEOUS | 3 refills | Status: AC

## 2022-07-09 ENCOUNTER — Other Ambulatory Visit (HOSPITAL_COMMUNITY): Payer: Self-pay

## 2022-07-12 ENCOUNTER — Other Ambulatory Visit (HOSPITAL_COMMUNITY): Payer: Self-pay

## 2022-07-16 ENCOUNTER — Encounter (HOSPITAL_BASED_OUTPATIENT_CLINIC_OR_DEPARTMENT_OTHER): Payer: Medicaid Other | Attending: General Surgery | Admitting: General Surgery

## 2022-07-16 DIAGNOSIS — D84821 Immunodeficiency due to drugs: Secondary | ICD-10-CM | POA: Insufficient documentation

## 2022-07-16 DIAGNOSIS — M0600 Rheumatoid arthritis without rheumatoid factor, unspecified site: Secondary | ICD-10-CM | POA: Insufficient documentation

## 2022-07-16 DIAGNOSIS — M0880 Other juvenile arthritis, unspecified site: Secondary | ICD-10-CM | POA: Insufficient documentation

## 2022-07-16 DIAGNOSIS — Z6841 Body Mass Index (BMI) 40.0 and over, adult: Secondary | ICD-10-CM | POA: Diagnosis not present

## 2022-07-16 DIAGNOSIS — L732 Hidradenitis suppurativa: Secondary | ICD-10-CM | POA: Insufficient documentation

## 2022-07-16 DIAGNOSIS — Z79899 Other long term (current) drug therapy: Secondary | ICD-10-CM | POA: Diagnosis not present

## 2022-07-16 NOTE — Progress Notes (Addendum)
Angie, Stone ([id number]) [id number] Page 1 of 9 Visit Report for 07/16/2022 Chief Complaint Document Details Patient Name: Date of Service: Angie Stone 07/16/2022 8:00 A M Medical Record Number: [id number] Patient Account Number: [id number] Date of Birth/Sex: Treating RN: 05/01/2000 (22 y.o. Female) Primary Care Provider: Georgette Shell Other Clinician: Referring Provider: Treating Provider/Extender: Cassell Clement in Treatment: 0 Information Obtained from: Patient Chief Complaint Patient presents to the wound care center with open inflammatory ulcer(s) due to hidradenitis supprativa Electronic Signature(s) Signed: 07/16/2022 9:50:35 AM By: Duanne Guess MD FACS Previous Signature: 07/16/2022 8:00:14 AM Version By: Duanne Guess MD FACS Entered By: Duanne Guess on 07/16/2022 09:50:34 -------------------------------------------------------------------------------- HPI Details Patient Name: Date of Service: [facility] Angie Stone 07/16/2022 8:00 A M Medical Record Number: [id number] Patient Account Number: [id number] Date of Birth/Sex: Treating RN: 06/20/2000 (22 y.o. Female) Primary Care Provider: Georgette Shell Other Clinician: Referring Provider: Treating Provider/Extender: Cassell Clement in Treatment: 0 History of Present Illness HPI Description: CONSULTATION ONLY 07/16/2022 This is a 22 year old woman with a past medical history significant for polyarticular juvenile idiopathic arthritis, seronegative rheumatoid arthritis, and hidradenitis suppurativa. She has been followed by dermatology and been on isotretinoin, but stopped this because it apparently caused her flares to be worse. She had been on Remicade for her rheumatoid arthritis. She was switched to Cosentyx and is currently receiving this. She is also on Rinvoq. She was recently seen in the emergency department and underwent incision and  drainage of an abscess on the back of her neck. She was prescribed doxycycline and has been applying mupirocin ointment. There has really been no improvement in her condition under the treatment of her local dermatologist and she has apparently been referred to [facility] dermatology given the severity and extent of her disease and lack of improvement with the treatments that have been prescribed. It appears that the patient was referred to the wound care center by her primary care provider prior to her ED visit on March 22. I am frankly not sure what we have to offer her in light of the failure of dermatology. On exam, she has evidence of hidradenitis involvement in both axillae, under both breasts, and in her pubic region. There are no obvious fluctuant abscesses that could potentially be drained. The back of her neck where she underwent incision and drainage appears to be healing well. Electronic Signature(s) Signed: 07/16/2022 9:52:12 AM By: Duanne Guess MD FACS Previous Signature: 07/16/2022 8:10:40 AM Version By: Duanne Guess MD FACS Entered By: Duanne Guess on 07/16/2022 09:52:12 -------------------------------------------------------------------------------- Physical Exam Details Patient Name: Date of Service: Angie Stone Angie Stone 07/16/2022 8:00 A M Medical Record Number: [id number] Patient Account Number: [id number] Date of Birth/Sex: Treating RN: December 14, 2000 (22 y.o. Female) Primary Care Provider: Georgette Shell Other Clinician: Referring Provider: Treating Provider/Extender: Cassell Clement in Treatment: 0 Constitutional . . . . Short stature. She is morbidly obese. No acute distress. LAJOYCE, TAMURA ([id number]) [id number] Page 2 of 9 Notes 07/16/2022: Hidradenitis involvement in the bilateral axillae, under both breasts, and in the pubic region. No fluctuant areas to suggest undrained abscesses. Electronic Signature(s) Signed:  07/16/2022 9:53:48 AM By: Duanne Guess MD FACS Entered By: Duanne Guess on 07/16/2022 09:53:48 -------------------------------------------------------------------------------- Physician Orders Details Patient Name: Date of Service: [facility] Angie Stone 07/16/2022 8:00 A M Medical Record Number: [id number] Patient Account Number: [id number] Date of Birth/Sex: Treating RN: [birth year] (22 y.o. 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 Anesthetic Wound #1 Left Axilla (In clinic) Topical Lidocaine 4% applied to wound bed - Used in [facility] clinic Wound #2 Right Axilla (In clinic) Topical Lidocaine 4% applied to wound bed - Used in [facility] clinic Wound #3 Right,Medial Breast (In clinic) Topical Lidocaine 4% applied to wound bed - Used in [facility] clinic Wound #4 Left,Medial Breast (In clinic) Topical Lidocaine 4% applied to wound bed - Used in [facility] clinic Wound #5 Right,Medial Upper Leg (In clinic) Topical Lidocaine 4% applied to wound bed - Used in [facility] clinic Wound #6 Left,Medial Upper Leg (In clinic) Topical Lidocaine 4% applied to wound bed - Used in [facility] clinic Wound #7 Left Pubis (In clinic) Topical Lidocaine 4% applied to wound bed - Used in [facility] clinic Wound Treatment Wound #1 - Axilla Wound Laterality: Left Cleanser: Soap and Water 1 x Per Day/30 Days Discharge Instructions: May shower and wash wound with dial antibacterial soap and water prior to dressing change. Cleanser: Wound Cleanser 1 x Per Day/30  Days Discharge Instructions: Cleanse the wound with wound cleanser prior to applying a clean dressing using gauze sponges, not tissue or cotton balls. Prim Dressing: Maxorb Extra Ag+ Alginate Dressing, 4x4.75 (in/in) 1 x Per Day/30 Days ary Discharge Instructions: Apply to wound bed as instructed Secondary Dressing: ALLEVYN Gentle Border, 5x5 (in/in) 1 x Per Day/30 Days Discharge Instructions: Apply over primary dressing as directed. Wound #2 - Axilla Wound Laterality: Right Cleanser: Soap and Water 1 x Per Day/30 Days Discharge Instructions: May shower and wash wound with dial antibacterial soap and water prior to dressing change. Cleanser: Wound Cleanser 1 x Per Day/30 Days Discharge Instructions: Cleanse the wound with wound cleanser prior to applying a clean dressing using gauze sponges, not tissue or cotton balls. Prim Dressing: Maxorb Extra Ag+ Alginate Dressing, 4x4.75 (in/in) ary 1 x Per Day/30 Days JAVONNA, BALLI ([id number]) [phone].pdf Page 3 of 9 Discharge Instructions: Apply to wound bed as instructed Secondary Dressing: ALLEVYN Gentle Border, 5x5 (in/in) 1 x Per Day/30 Days Discharge Instructions: Apply over primary dressing as directed. Wound #3 - Breast Wound Laterality: Right, Medial Cleanser: Soap and Water 1 x Per Day/30 Days Discharge Instructions: May shower and wash wound with dial antibacterial soap and water prior to dressing change. Cleanser: Wound Cleanser 1 x Per Day/30 Days Discharge Instructions: Cleanse the wound with wound cleanser prior to applying a clean dressing using gauze sponges, not tissue or cotton balls. Prim Dressing: Maxorb Extra Ag+ Alginate Dressing, 4x4.75 (in/in) 1 x Per Day/30 Days ary Discharge Instructions: Apply to wound bed as instructed Secondary Dressing: ALLEVYN Gentle Border, 5x5 (in/in) 1 x Per Day/30 Days Discharge Instructions: Apply over primary dressing as directed. Wound #4 - Breast Wound  Laterality: Left, Medial Cleanser: Soap and Water 1 x Per Day/30 Days Discharge Instructions: May shower and wash wound with dial antibacterial soap and water prior to dressing change. Cleanser: Wound Cleanser 1 x Per Day/30 Days Discharge Instructions: Cleanse the wound with wound cleanser prior to applying a clean dressing using gauze sponges, not tissue or cotton balls. Prim Dressing: Maxorb Extra Ag+ Alginate Dressing, 4x4.75 (in/in) 1 x Per Day/30 Days ary Discharge Instructions: Apply to wound bed  as instructed Secondary Dressing: ALLEVYN Gentle Border, 5x5 (in/in) 1 x Per Day/30 Days Discharge Instructions: Apply over primary dressing as directed. Wound #5 - Upper Leg Wound Laterality: Right, Medial Cleanser: Soap and Water 1 x Per Day/30 Days Discharge Instructions: May shower and wash wound with dial antibacterial soap and water prior to dressing change. Cleanser: Wound Cleanser 1 x Per Day/30 Days Discharge Instructions: Cleanse the wound with wound cleanser prior to applying a clean dressing using gauze sponges, not tissue or cotton balls. Prim Dressing: Maxorb Extra Ag+ Alginate Dressing, 4x4.75 (in/in) 1 x Per Day/30 Days ary Discharge Instructions: Apply to wound bed as instructed Secondary Dressing: ALLEVYN Gentle Border, 5x5 (in/in) 1 x Per Day/30 Days Discharge Instructions: Apply over primary dressing as directed. Wound #6 - Upper Leg Wound Laterality: Left, Medial Cleanser: Soap and Water 1 x Per Day/30 Days Discharge Instructions: May shower and wash wound with dial antibacterial soap and water prior to dressing change. Cleanser: Wound Cleanser 1 x Per Day/30 Days Discharge Instructions: Cleanse the wound with wound cleanser prior to applying a clean dressing using gauze sponges, not tissue or cotton balls. Prim Dressing: Maxorb Extra Ag+ Alginate Dressing, 4x4.75 (in/in) 1 x Per Day/30 Days ary Discharge Instructions: Apply to wound bed as instructed Secondary  Dressing: ALLEVYN Gentle Border, 5x5 (in/in) 1 x Per Day/30 Days Discharge Instructions: Apply over primary dressing as directed. Wound #7 - Pubis Wound Laterality: Left Cleanser: Soap and Water 1 x Per Day/30 Days Discharge Instructions: May shower and wash wound with dial antibacterial soap and water prior to dressing change. Cleanser: Wound Cleanser 1 x Per Day/30 Days Discharge Instructions: Cleanse the wound with wound cleanser prior to applying a clean dressing using gauze sponges, not tissue or cotton balls. Prim Dressing: Maxorb Extra Ag+ Alginate Dressing, 4x4.75 (in/in) 1 x Per Day/30 Days ary Discharge Instructions: Apply to wound bed as instructed Secondary Dressing: ALLEVYN Gentle Border, 5x5 (in/in) 1 x Per Day/30 Days Discharge Instructions: Apply over primary dressing as directed. Consults General Surgery - Extensive Hidradenitis Suppurativa,consider excision. [facility] (**NOT [facility]) - (ICD10 L73.2 - Hidradenitis suppurativa) PAISLIE, TESSLER ([id number]) [id number] Page 4 of 9 Electronic Signature(s) Signed: 07/16/2022 10:57:43 AM By: Duanne Guess MD FACS Entered By: Duanne Guess on 07/16/2022 09:54:35 Prescription 07/16/2022 -------------------------------------------------------------------------------- Benjamine Mola MD Patient Name: Provider: [birth year] [id number] Date of Birth: NPI#: Female [id number] Sex: DEA #: [phone] Phone #: License #: Eligha Bridegroom [facility] Patient Address: [address] [place], [place] [address] [address] [place], [place] [address] [phone] Allergies No Known Allergies Provider's Orders General Surgery - ICD10: L73.2 - Extensive Hidradenitis Suppurativa,consider excision. [facility] (**NOT [facility]) Hand Signature: Date(s): Electronic Signature(s) Signed:  07/16/2022 10:01:18 AM By: Duanne Guess MD FACS Entered By: Duanne Guess on 07/16/2022 10:01:17 -------------------------------------------------------------------------------- Problem List Details Patient Name: Date of Service: [facility] Angie Stone 07/16/2022 8:00 A M Medical Record Number: [id number] Patient Account Number: [id number] Date of Birth/Sex: Treating RN: 13-Aug-2000 (21 y.o. 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 07/16/2022 No Yes M06.00 Rheumatoid arthritis without rheumatoid factor, unspecified site 07/16/2022 No Yes Inactive Problems Ishler, Angie Stone ([id number]) [phone].pdf Page 5 of  9 Resolved Problems Electronic Signature(s) Signed: 07/16/2022 7:59:47 AM By: Duanne Guess MD FACS Entered By: Duanne Guess on 07/16/2022 07:59:46 -------------------------------------------------------------------------------- Progress Note Details Patient Name: Date of Service: [facility] Angie Stone 07/16/2022 8:00 A M Medical Record Number: [id number] Patient Account Number: [id number] Date of Birth/Sex: Treating RN: [birth year] (21 y.o. Female) Primary Care Provider: Georgette Shell Other Clinician: Referring Provider: Treating Provider/Extender: Cassell Clement in Treatment: 0 Subjective Chief Complaint Information obtained from Patient Patient presents to the wound care center with open inflammatory ulcer(s) due to hidradenitis supprativa History of Present Illness (HPI) CONSULTATION ONLY 07/16/2022 This is a 22 year old woman with a past medical history significant for  polyarticular juvenile idiopathic arthritis, seronegative rheumatoid arthritis, and hidradenitis suppurativa. She has been followed by dermatology and been on isotretinoin, but stopped this because it apparently caused her flares to be worse. She had been on Remicade for her rheumatoid arthritis. She was switched to Cosentyx and is currently receiving this. She is also on Rinvoq. She was recently seen in the emergency department and underwent incision and drainage of an abscess on the back of her neck. She was prescribed doxycycline and has been applying mupirocin ointment. There has really been no improvement in her condition under the treatment of her local dermatologist and she has apparently been referred to [facility] dermatology given the severity and extent of her disease and lack of improvement with the treatments that have been prescribed. It appears that the patient was referred to the wound care center by her primary care provider prior to her ED visit on March 22. I am frankly not sure what we have to offer her in light of the failure of dermatology. On exam, she has evidence of hidradenitis involvement in both axillae, under both breasts, and in her pubic region. There are no obvious fluctuant abscesses that could potentially be drained. The back of her neck where she underwent incision and drainage appears to be healing well. Patient History Information obtained from Patient. Allergies No Known Allergies Family History Unknown History. Social History Never smoker, Marital Status - Single, Alcohol Use - Never, Drug Use - No History, Caffeine Use - Never. Medical History Musculoskeletal Patient has history of Rheumatoid Arthritis Review of Systems (ROS) Integumentary (Skin) Complains or has symptoms of Wounds - Hx: H.S.. Objective Constitutional Short stature. She is morbidly obese. No acute distress. Vitals Time Taken: 8:07 AM, Height: 59 in, Weight: 253 lbs, BMI: 51.1, Temperature:  98.6 F, Pulse: 73 bpm, Respiratory Rate: 20 breaths/min, Blood Pressure: 123/92 mmHg. General Notes: 07/16/2022: Hidradenitis involvement in the bilateral axillae, under both breasts, and in the pubic region. No fluctuant areas to suggest undrained abscesses. Integumentary (Hair, Skin) Angie Stone, Angie Stone ([id number]) [id number] Page 6 of 9 Wound #1 status is Open. Original cause of wound was Other Lesion. The date acquired was: 02/20/2022. The wound is located on the Left Axilla. The wound measures 6cm length x 4cm width x 0.1cm depth; 18.85cm^2 area and 1.885cm^3 volume. There is Fat Layer (Subcutaneous Tissue) exposed. There is no tunneling or undermining noted. There is a medium amount of serosanguineous drainage noted. There is medium (34-66%) red granulation within the wound bed. There is a medium (34-66%) amount of necrotic tissue within the wound bed including Adherent Slough. The periwound skin appearance had no abnormalities noted for moisture. The periwound skin appearance had no abnormalities noted for color. The periwound skin appearance exhibited: Scarring. Periwound temperature was noted as No Abnormality. Wound #2 status is Open. Original  cause of wound was Other Lesion. The date acquired was: 02/20/2022. The wound is located on the Right Axilla. The wound measures 5cm length x 3cm width x 0.1cm depth; 11.781cm^2 area and 1.178cm^3 volume. There is no tunneling or undermining noted. There is large (67- 100%) red granulation within the wound bed. There is a small (1-33%) amount of necrotic tissue within the wound bed including Adherent Slough. The periwound skin appearance had no abnormalities noted for moisture. The periwound skin appearance had no abnormalities noted for color. The periwound skin appearance exhibited: Scarring. Periwound temperature was noted as No Abnormality. Wound #3 status is Open. Original cause of wound was Other Lesion. The date  acquired was: 02/20/2022. The wound is located on the Right,Medial Breast. The wound measures 5cm length x 9cm width x 0.1cm depth; 35.343cm^2 area and 3.534cm^3 volume. There is Fat Layer (Subcutaneous Tissue) exposed. There is no tunneling or undermining noted. There is small (1-33%) red granulation within the wound bed. There is a large (67-100%) amount of necrotic tissue within the wound bed including Adherent Slough. The periwound skin appearance had no abnormalities noted for moisture. The periwound skin appearance had no abnormalities noted for color. The periwound skin appearance exhibited: Scarring. Periwound temperature was noted as No Abnormality. Wound #4 status is Open. Original cause of wound was Other Lesion. The date acquired was: 02/20/2022. The wound is located on the Left,Medial Breast. The wound measures 6cm length x 5cm width x 0.1cm depth; 23.562cm^2 area and 2.356cm^3 volume. There is Fat Layer (Subcutaneous Tissue) exposed. There is no tunneling or undermining noted. There is a medium amount of serosanguineous drainage noted. There is large (67-100%) red granulation within the wound bed. There is a small (1-33%) amount of necrotic tissue within the wound bed including Adherent Slough. The periwound skin appearance had no abnormalities noted for moisture. The periwound skin appearance had no abnormalities noted for color. The periwound skin appearance exhibited: Scarring. Periwound temperature was noted as No Abnormality. Wound #5 status is Open. Original cause of wound was Other Lesion. The date acquired was: 02/20/2022. The wound is located on the Right,Medial Upper Leg. The wound measures 0.1cm length x 0.1cm width x 0.1cm depth; 0.008cm^2 area and 0.001cm^3 volume. There is no tunneling or undermining noted. There is a medium amount of serosanguineous drainage noted. There is medium (34-66%) red granulation within the wound bed. There is a medium (34-66%) amount of necrotic  tissue within the wound bed including Adherent Slough. The periwound skin appearance had no abnormalities noted for moisture. The periwound skin appearance had no abnormalities noted for color. The periwound skin appearance exhibited: Scarring. Periwound temperature was noted as No Abnormality. Wound #6 status is Open. Original cause of wound was Other Lesion. The date acquired was: 02/20/2022. The wound is located on the Left,Medial Upper Leg. The wound measures 7cm length x 0.1cm width x 0.1cm depth; 0.55cm^2 area and 0.055cm^3 volume. There is no tunneling or undermining noted. There is a medium amount of serosanguineous drainage noted. There is medium (34-66%) red granulation within the wound bed. There is a medium (34-66%) amount of necrotic tissue within the wound bed including Adherent Slough. The periwound skin appearance had no abnormalities noted for moisture. The periwound skin appearance had no abnormalities noted for color. The periwound skin appearance exhibited: Scarring. Periwound temperature was noted as No Abnormality. Wound #7 status is Open. Original cause of wound was Other Lesion. The date acquired was: 02/20/2022. The wound is located on the Left Pubis.  The wound measures 0.3cm length x 0.2cm width x 0.1cm depth; 0.047cm^2 area and 0.005cm^3 volume. There is Fat Layer (Subcutaneous Tissue) exposed. There is no tunneling or undermining noted. There is a medium amount of serosanguineous drainage noted. There is large (67-100%) red granulation within the wound bed. There is a small (1-33%) amount of necrotic tissue within the wound bed including Adherent Slough. The periwound skin appearance had no abnormalities noted for moisture. The periwound skin appearance had no abnormalities noted for color. The periwound skin appearance exhibited: Scarring. Periwound temperature was noted as No Abnormality. Assessment Active Problems ICD-10 Hidradenitis suppurativa Other juvenile  arthritis, unspecified site Immunodeficiency due to drugs Rheumatoid arthritis without rheumatoid factor, unspecified site Plan Follow-up Appointments: Other: - Reminder of General Surgery will contact you. Anesthetic: Wound #1 Left Axilla: (In clinic) Topical Lidocaine 4% applied to wound bed - Used in [facility] clinic Wound #2 Right Axilla: (In clinic) Topical Lidocaine 4% applied to wound bed - Used in [facility] clinic Wound #3 Right,Medial Breast: (In clinic) Topical Lidocaine 4% applied to wound bed - Used in [facility] clinic Wound #4 Left,Medial Breast: (In clinic) Topical Lidocaine 4% applied to wound bed - Used in [facility] clinic Wound #5 Right,Medial Upper Leg: (In clinic) Topical Lidocaine 4% applied to wound bed - Used in [facility] clinic Wound #6 Left,Medial Upper Leg: (In clinic) Topical Lidocaine 4% applied to wound bed - Used in [facility] clinic Wound #7 Left Pubis: (In clinic) Topical Lidocaine 4% applied to wound bed - Used in [facility] clinic Consults ordered were: General Surgery - Extensive Hidradenitis Suppurativa,consider excision. [facility] (**NOT [facility]) WOUND #1: - Axilla Wound Laterality: Left Cleanser: Soap and Water 1 x Per Day/30 Days Discharge Instructions: May shower and wash wound with dial antibacterial soap and water prior to dressing change. Cleanser: Wound Cleanser 1 x Per Day/30 Days Discharge Instructions: Cleanse the wound with wound cleanser prior to applying a clean dressing using gauze sponges, not tissue or cotton balls. Prim Dressing: Maxorb Extra Ag+ Alginate Dressing, 4x4.75 (in/in) 1 x Per Day/30 Days OTILLIA, CORDONE ([id number]) [id number] Page 7 of 9 Discharge Instructions: Apply to wound bed as instructed Secondary Dressing: ALLEVYN Gentle Border, 5x5 (in/in) 1 x Per Day/30 Days Discharge Instructions: Apply over primary dressing as directed. WOUND #2: - Axilla Wound Laterality: Right Cleanser: Soap and  Water 1 x Per Day/30 Days Discharge Instructions: May shower and wash wound with dial antibacterial soap and water prior to dressing change. Cleanser: Wound Cleanser 1 x Per Day/30 Days Discharge Instructions: Cleanse the wound with wound cleanser prior to applying a clean dressing using gauze sponges, not tissue or cotton balls. Prim Dressing: Maxorb Extra Ag+ Alginate Dressing, 4x4.75 (in/in) 1 x Per Day/30 Days ary Discharge Instructions: Apply to wound bed as instructed Secondary Dressing: ALLEVYN Gentle Border, 5x5 (in/in) 1 x Per Day/30 Days Discharge Instructions: Apply over primary dressing as directed. WOUND #3: - Breast Wound Laterality: Right, Medial Cleanser: Soap and Water 1 x Per Day/30 Days Discharge Instructions: May shower and wash wound with dial antibacterial soap and water prior to dressing change. Cleanser: Wound Cleanser 1 x Per Day/30 Days Discharge Instructions: Cleanse the wound with wound cleanser prior to applying a clean dressing using gauze sponges, not tissue or cotton balls. Prim Dressing: Maxorb Extra Ag+ Alginate Dressing, 4x4.75 (in/in) 1 x Per Day/30 Days ary Discharge Instructions: Apply to wound bed as instructed Secondary Dressing: ALLEVYN Gentle Border, 5x5 (in/in) 1 x Per Day/30 Days  Discharge Instructions: Apply over primary dressing as directed. WOUND #4: - Breast Wound Laterality: Left, Medial Cleanser: Soap and Water 1 x Per Day/30 Days Discharge Instructions: May shower and wash wound with dial antibacterial soap and water prior to dressing change. Cleanser: Wound Cleanser 1 x Per Day/30 Days Discharge Instructions: Cleanse the wound with wound cleanser prior to applying a clean dressing using gauze sponges, not tissue or cotton balls. Prim Dressing: Maxorb Extra Ag+ Alginate Dressing, 4x4.75 (in/in) 1 x Per Day/30 Days ary Discharge Instructions: Apply to wound bed as instructed Secondary Dressing: ALLEVYN Gentle Border, 5x5 (in/in) 1 x Per  Day/30 Days Discharge Instructions: Apply over primary dressing as directed. WOUND #5: - Upper Leg Wound Laterality: Right, Medial Cleanser: Soap and Water 1 x Per Day/30 Days Discharge Instructions: May shower and wash wound with dial antibacterial soap and water prior to dressing change. Cleanser: Wound Cleanser 1 x Per Day/30 Days Discharge Instructions: Cleanse the wound with wound cleanser prior to applying a clean dressing using gauze sponges, not tissue or cotton balls. Prim Dressing: Maxorb Extra Ag+ Alginate Dressing, 4x4.75 (in/in) 1 x Per Day/30 Days ary Discharge Instructions: Apply to wound bed as instructed Secondary Dressing: ALLEVYN Gentle Border, 5x5 (in/in) 1 x Per Day/30 Days Discharge Instructions: Apply over primary dressing as directed. WOUND #6: - Upper Leg Wound Laterality: Left, Medial Cleanser: Soap and Water 1 x Per Day/30 Days Discharge Instructions: May shower and wash wound with dial antibacterial soap and water prior to dressing change. Cleanser: Wound Cleanser 1 x Per Day/30 Days Discharge Instructions: Cleanse the wound with wound cleanser prior to applying a clean dressing using gauze sponges, not tissue or cotton balls. Prim Dressing: Maxorb Extra Ag+ Alginate Dressing, 4x4.75 (in/in) 1 x Per Day/30 Days ary Discharge Instructions: Apply to wound bed as instructed Secondary Dressing: ALLEVYN Gentle Border, 5x5 (in/in) 1 x Per Day/30 Days Discharge Instructions: Apply over primary dressing as directed. WOUND #7: - Pubis Wound Laterality: Left Cleanser: Soap and Water 1 x Per Day/30 Days Discharge Instructions: May shower and wash wound with dial antibacterial soap and water prior to dressing change. Cleanser: Wound Cleanser 1 x Per Day/30 Days Discharge Instructions: Cleanse the wound with wound cleanser prior to applying a clean dressing using gauze sponges, not tissue or cotton balls. Prim Dressing: Maxorb Extra Ag+ Alginate Dressing, 4x4.75 (in/in) 1 x  Per Day/30 Days ary Discharge Instructions: Apply to wound bed as instructed Secondary Dressing: ALLEVYN Gentle Border, 5x5 (in/in) 1 x Per Day/30 Days Discharge Instructions: Apply over primary dressing as directed. 07/16/2022: This is a 22 year old woman with hidradenitis suppurativa. Her disease has been quite refractory to multiple treatment modalities. She does have the involvement of dermatology already. She actually does not have any open wounds amenable to treatment by our clinic, nor does she have any abscesses that required drainage today. Fortunately, she is not diabetic and she does not smoke, but she is black and morbidly obese and also has other autoimmune disorders. We really do not have anything to offer her here in the wound center. The patient and her mother are both interested in surgical evaluation. Apparently, her insurance is not accepted by [facility] so we will place a referral to general surgery at [facility] to discuss potential surgical excision of her lesions. I spent some time discussing the nature of this procedure with the patient and her mother, including the extent of the incisions, the need for healing by secondary intention in  many cases and the prolonged nature of healing and pain associated with this. They are still interested in pursuing the referral to at least discuss the possibility. We will place this referral on their behalf. We dressed her wounds with silver alginate and silicone border dressings. There is no need for ongoing follow-up in our center at this time. Electronic Signature(s) Signed: 07/16/2022 9:57:46 AM By: Duanne Guess MD FACS Entered By: Duanne Guess on 07/16/2022 09:57:45 -------------------------------------------------------------------------------- HxROS Details Patient Name: Date of Service: Angie Stone, Angie Stone Angie Stone 07/16/2022 8:00 A M Medical Record Number: [id number] Patient Account Number: [id number] Date  of Birth/Sex: Treating RN: 2001/03/17 (21 y.o. Female) Karie Schwalbe Primary Care Provider: Georgette Shell Other Clinician: Referring Provider: Treating Provider/Extender: Cassell Clement in Treatment: 0 Solomon, Angie Stone ([id number]) [id number] Page 8 of 9 Information Obtained From Patient Integumentary (Skin) Complaints and Symptoms: Positive for: Wounds - Hx: H.S. Immunological Musculoskeletal Medical History: Positive for: Rheumatoid Arthritis Oncologic Immunizations Pneumococcal Vaccine: Received Pneumococcal Vaccination: No Implantable Devices None Family and Social History Unknown History: Yes; Never smoker; Marital Status - Single; Alcohol Use: Never; Drug Use: No History; Caffeine Use: Never; Financial Concerns: No; Food, Clothing or Shelter Needs: No; Support System Lacking: No; Transportation Concerns: No Electronic Signature(s) Signed: 07/16/2022 10:57:43 AM By: Duanne Guess MD FACS Signed: 07/16/2022 3:59:59 PM By: Karie Schwalbe RN Entered By: Karie Schwalbe on 07/16/2022 08:57:16 -------------------------------------------------------------------------------- SuperBill Details Patient Name: Date of Service: [facility] Angie Stone 07/16/2022 Medical Record Number: [id number] Patient Account Number: [id number] Date of Birth/Sex: Treating RN: 03/30/2001 (21 y.o. 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

## 2022-07-16 NOTE — Progress Notes (Signed)
Angie Stone ([id number]) [id number] Page 1 of 15 Visit Report for 07/16/2022 Allergy List Details Patient Name: Date of Service: Angie Stone 07/16/2022 8:00 A M Medical Record Number: [id number] Patient Account Number: [id number] Date of Birth/Sex: Treating RN: Aug 16, 2000 (22 y.o. Female) Karie Schwalbe Primary Care Ervine Witucki: Georgette Shell Other Clinician: Referring Cassandra Mcmanaman: Treating Donyale Falcon/Extender: Higinio Roger Weeks in Treatment: 0 Allergies Active Allergies No Known Allergies Allergy Notes Electronic Signature(s) Signed: 07/16/2022 3:59:59 PM By: Karie Schwalbe RN Entered By: Karie Schwalbe on 07/16/2022 08:18:55 -------------------------------------------------------------------------------- Arrival Information Details Patient Name: Date of Service: Angie Stone 07/16/2022 8:00 A M Medical Record Number: [id number] Patient Account Number: [id number] Date of Birth/Sex: Treating RN: Feb 28, 2001 (22 y.o. Female) Primary Care Gisell Buehrle: Georgette Shell Other Clinician: Referring Autumn Pruitt: Treating Morelia Cassells/Extender: Cassell Clement in Treatment: 0 Visit Information Patient Arrived: Ambulatory Arrival Time: 08:06 Accompanied By: self Transfer Assistance: None Patient Identification Verified: Yes Secondary Verification Process Completed: Yes Electronic Signature(s) Signed: 07/16/2022 12:57:04 PM By: Dayton Scrape Entered By: Dayton Scrape on 07/16/2022 08:07:06 -------------------------------------------------------------------------------- Clinic Level of Care Assessment Details Patient Name: Date of Service: Angie Stone 07/16/2022 8:00 A M Medical Record Number: [id number] Patient Account Number: [id number] Date of Birth/Sex: Treating RN: Oct 19, 2000 (22 y.o. Female) Karie Schwalbe Primary Care Teigen Bellin: Georgette Shell Other Clinician: Referring Evolet Salminen: Treating Carylon Tamburro/Extender: Cassell Clement in Treatment: 0 Clinic Level of Care Assessment Items TOOL 1 Quantity Score X- 1 0 Use when EandM and Procedure is performed on INITIAL visit ASSESSMENTS - Nursing Assessment / Reassessment X- 1 20 General Physical Exam (combine w/ comprehensive assessment (listed just below) when performed on new pt. evals) X- 1 25 Comprehensive Assessment (HX, ROS, Risk Assessments, Wounds Hx, etc.) ASSESSMENTS - Wound and Skin Assessment / Reassessment []  - 0 Dermatologic / Skin Assessment (not related to wound area) Montesano, [name] ([id number]) [phone].pdf Page 2 of 15 ASSESSMENTS - Ostomy and/or Continence Assessment and Care []  - 0 Incontinence Assessment and Management []  - 0 Ostomy Care Assessment and Management (repouching, etc.) PROCESS - Coordination of Care []  - 0 Simple Patient / Family Education for ongoing care X- 1 20 Complex (extensive) Patient / Family Education for ongoing care X- 1 10 Staff obtains Chiropractor, Records, T Results / Process Orders est X- 1 10 Staff telephones HHA, Nursing Homes / Clarify orders / etc []  - 0 Routine Transfer to another Facility (non-emergent condition) []  - 0 Routine Hospital Admission (non-emergent condition) X- 1 15 New Admissions / Manufacturing engineer / Ordering NPWT Apligraf, etc. , []  - 0 Emergency Hospital Admission (emergent condition) PROCESS - Special Needs []  - 0 Pediatric / Minor Patient Management []  - 0 Isolation Patient Management []  - 0 Hearing / Language / Visual special needs []  - 0 Assessment of Community assistance (transportation, D/C planning, etc.) X- 1 15 Additional assistance / Altered mentation []  - 0 Support Surface(s) Assessment (bed, cushion, seat, etc.) INTERVENTIONS - Miscellaneous []  - 0 External ear exam []  - 0 Patient Transfer (multiple staff / Nurse, adult / Similar devices) []  - 0 Simple Staple / Suture removal (25 or less) []  -  0 Complex Staple / Suture removal (26 or more) []  - 0 Hypo/Hyperglycemic Management (do not check if billed separately) []  - 0 Ankle / Brachial Index (ABI) - do not check if billed separately Has the patient been seen at the hospital within the last three years: Yes Total Score: 115 Level Of Care: New/Established - Level  3 Electronic Signature(s) Signed: 07/16/2022 3:59:59 PM By: Karie Schwalbe RN Entered By: Karie Schwalbe on 07/16/2022 09:43:57 -------------------------------------------------------------------------------- Encounter Discharge Information Details Patient Name: Date of Service: Angie Stone 07/16/2022 8:00 A M Medical Record Number: [id number] Patient Account Number: [id number] Date of Birth/Sex: Treating RN: 2000/09/05 (22 y.o. Female) Karie Schwalbe Primary Care Siddiq Kaluzny: Georgette Shell Other Clinician: Referring Jahbari Repinski: Treating Meagon Duskin/Extender: Cassell Clement in Treatment: 0 Encounter Discharge Information Items Discharge Condition: Stable Ambulatory Status: Ambulatory Discharge Destination: Home Transportation: Private Auto Accompanied By: Mother Schedule Follow-up Appointment: Yes Clinical Summary of Care: Patient Declined Electronic Signature(s) [place], [name] ([id number]) [id number] Page 3 of 15 Signed: 07/16/2022 3:59:59 PM By: Karie Schwalbe RN Entered By: Karie Schwalbe on 07/16/2022 09:45:08 -------------------------------------------------------------------------------- Lower Extremity Assessment Details Patient Name: Date of Service: Angie Stone 07/16/2022 8:00 A M Medical Record Number: [id number] Patient Account Number: [id number] Date of Birth/Sex: Treating RN: 06-05-2000 (22 y.o. Female) Karie Schwalbe Primary Care Neythan Kozlov: Georgette Shell Other Clinician: Referring Zafiro Routson: Treating Evie Crumpler/Extender: Cassell Clement in Treatment: 0 Electronic  Signature(s) Signed: 07/16/2022 3:59:59 PM By: Karie Schwalbe RN Entered By: Karie Schwalbe on 07/16/2022 09:04:06 -------------------------------------------------------------------------------- Multi Wound Chart Details Patient Name: Date of Service: [facility] Stone 07/16/2022 8:00 A M Medical Record Number: [id number] Patient Account Number: [id number] Date of Birth/Sex: Treating RN: 04/18/2000 (21 y.o. Female) Primary Care Dameisha Tschida: Georgette Shell Other Clinician: Referring Annayah Worthley: Treating Hogan Hoobler/Extender: Cassell Clement in Treatment: 0 Vital Signs Height(in): 59 Pulse(bpm): 73 Weight(lbs): 253 Blood Pressure(mmHg): 123/92 Body Mass Index(BMI): 51.1 Temperature(F): 98.6 Respiratory Rate(breaths/min): 20 [1:Photos:] Left Axilla Right Axilla Right, Medial Breast Wound Location: Other Lesion Other Lesion Other Lesion Wounding Event: Hidradenitis Hidradenitis Hidradenitis Primary Etiology: 02/20/2022 02/20/2022 02/20/2022 Date Acquired: 0 0 0 Weeks of Treatment: Open Open Open Wound Status: No No No Wound Recurrence: Yes Yes Yes Clustered Wound: 6x4x0.1 5x3x0.1 5x9x0.1 Measurements L x W x D (cm) 18.85 11.781 35.343 A (cm) : rea 1.885 1.178 3.534 Volume (cm) : Full Thickness Without Exposed Full Thickness Without Exposed Full Thickness Without Exposed Classification: Support Structures Support Structures Support Structures Medium N/A N/A Exudate Amount: Serosanguineous N/A N/A Exudate Type: red, brown N/A N/A Exudate Color: Medium (34-66%) Large (67-100%) Small (1-33%) Granulation Amount: Red Red Red Granulation Quality: Medium (34-66%) Small (1-33%) Large (67-100%) Necrotic Amount: Fat Layer (Subcutaneous Tissue): Yes Fascia: No Fat Layer (Subcutaneous Tissue): Yes Exposed Structures: Fat Layer (Subcutaneous Tissue): No Fascia: No Tendon: No Tendon: No Muscle: No Muscle: No Joint: No Joint: No Bone: No Bone:  No None N/A Small (1-33%) Epithelialization: Scarring: Yes Scarring: Yes Scarring: Yes Periwound Skin Texture: No Abnormalities Noted No Abnormalities Noted No Abnormalities Noted Periwound Skin MoistureKERAH, HARDEBECK ([id number]) [id number] Page 4 of 15 No Abnormalities Noted No Abnormalities Noted No Abnormalities Noted Periwound Skin Color: No Abnormality No Abnormality No Abnormality Temperature: Wound Number: 4 5 6  Photos: Left, Medial Breast Right, Medial Upper Leg Left, Medial Upper Leg Wound Location: Other Lesion Other Lesion Other Lesion Wounding Event: Hidradenitis Hidradenitis Hidradenitis Primary Etiology: 02/20/2022 02/20/2022 02/20/2022 Date Acquired: 0 0 0 Weeks of Treatment: Open Open Open Wound Status: No No No Wound Recurrence: Yes Yes Yes Clustered Wound: 6x5x0.1 0.1x0.1x0.1 7x0.1x0.1 Measurements L x W x D (cm) 23.562 0.008 0.55 A (cm) : rea 2.356 0.001 0.055 Volume (cm) : Full Thickness Without Exposed Full Thickness Without Exposed Full Thickness Without Exposed Classification: Support Structures Support Structures Support Structures Medium Medium Medium Exudate Amount:  Serosanguineous Serosanguineous Serosanguineous Exudate Type: red, brown red, brown red, brown Exudate Color: Large (67-100%) Medium (34-66%) Medium (34-66%) Granulation Amount: Red Red Red Granulation Quality: Small (1-33%) Medium (34-66%) Medium (34-66%) Necrotic Amount: Fat Layer (Subcutaneous Tissue): Yes Fascia: No Fascia: No Exposed Structures: Fascia: No Fat Layer (Subcutaneous Tissue): No Fat Layer (Subcutaneous Tissue): No Tendon: No Tendon: No Tendon: No Muscle: No Muscle: No Muscle: No Joint: No Joint: No Joint: No Bone: No Bone: No Bone: No None None N/A Epithelialization: Scarring: Yes Scarring: Yes Scarring: Yes Periwound Skin Texture: No Abnormalities Noted No Abnormalities Noted No Abnormalities  Noted Periwound Skin Moisture: No Abnormalities Noted No Abnormalities Noted No Abnormalities Noted Periwound Skin Color: No Abnormality No Abnormality No Abnormality Temperature: Wound Number: 7 N/A N/A Photos: N/A N/A Left Pubis N/A N/A Wound Location: Other Lesion N/A N/A Wounding Event: Hidradenitis N/A N/A Primary Etiology: 02/20/2022 N/A N/A Date Acquired: 0 N/A N/A Weeks of Treatment: Open N/A N/A Wound Status: No N/A N/A Wound Recurrence: Yes N/A N/A Clustered Wound: 0.3x0.2x0.1 N/A N/A Measurements L x W x D (cm) 0.047 N/A N/A A (cm) : rea 0.005 N/A N/A Volume (cm) : Full Thickness Without Exposed N/A N/A Classification: Support Structures Medium N/A N/A Exudate Amount: Serosanguineous N/A N/A Exudate Type: red, brown N/A N/A Exudate Color: Large (67-100%) N/A N/A Granulation Amount: Red N/A N/A Granulation Quality: Small (1-33%) N/A N/A Necrotic Amount: Fat Layer (Subcutaneous Tissue): Yes N/A N/A Exposed Structures: Fascia: No Tendon: No Muscle: No Joint: No Bone: No None N/A N/A Epithelialization: Scarring: Yes N/A N/A Periwound Skin Texture: No Abnormalities Noted N/A N/A Periwound Skin Moisture: No Abnormalities Noted N/A N/A Periwound Skin ColorRAMON, BRANT ([id number]) [id number] Page 5 of 15 No Abnormality N/A N/A Temperature: Treatment Notes Wound #1 (Axilla) Wound Laterality: Left Cleanser Soap and Water Discharge Instruction: May shower and wash wound with dial antibacterial soap and water prior to dressing change. Wound Cleanser Discharge Instruction: Cleanse the wound with wound cleanser prior to applying a clean dressing using gauze sponges, not tissue or cotton balls. Peri-Wound Care Topical Primary Dressing Maxorb Extra Ag+ Alginate Dressing, 4x4.75 (in/in) Discharge Instruction: Apply to wound bed as instructed Secondary Dressing ALLEVYN Gentle Border, 5x5 (in/in) Discharge  Instruction: Apply over primary dressing as directed. Secured With Compression Wrap Compression Stockings Add-Ons Wound #2 (Axilla) Wound Laterality: Right Cleanser Soap and Water Discharge Instruction: May shower and wash wound with dial antibacterial soap and water prior to dressing change. Wound Cleanser Discharge Instruction: Cleanse the wound with wound cleanser prior to applying a clean dressing using gauze sponges, not tissue or cotton balls. Peri-Wound Care Topical Primary Dressing Maxorb Extra Ag+ Alginate Dressing, 4x4.75 (in/in) Discharge Instruction: Apply to wound bed as instructed Secondary Dressing ALLEVYN Gentle Border, 5x5 (in/in) Discharge Instruction: Apply over primary dressing as directed. Secured With Compression Wrap Compression Stockings Add-Ons Wound #3 (Breast) Wound Laterality: Right, Medial Cleanser Soap and Water Discharge Instruction: May shower and wash wound with dial antibacterial soap and water prior to dressing change. Wound Cleanser Discharge Instruction: Cleanse the wound with wound cleanser prior to applying a clean dressing using gauze sponges, not tissue or cotton balls. Peri-Wound Care Topical Primary Dressing Maxorb Extra Ag+ Alginate Dressing, 4x4.75 (in/in) Discharge Instruction: Apply to wound bed as instructed Secondary Dressing TATJANA, TURCOTT ([id number]) [id number] Page 6 of 15 ALLEVYN Gentle Border, 5x5 (in/in) Discharge Instruction: Apply over primary dressing as directed. Secured With Compression Wrap Compression Stockings Add-Ons Wound #4 (Breast) Wound Laterality: Left, Medial Cleanser  Soap and Water Discharge Instruction: May shower and wash wound with dial antibacterial soap and water prior to dressing change. Wound Cleanser Discharge Instruction: Cleanse the wound with wound cleanser prior to applying a clean dressing using gauze sponges, not tissue or cotton balls. Peri-Wound  Care Topical Primary Dressing Maxorb Extra Ag+ Alginate Dressing, 4x4.75 (in/in) Discharge Instruction: Apply to wound bed as instructed Secondary Dressing ALLEVYN Gentle Border, 5x5 (in/in) Discharge Instruction: Apply over primary dressing as directed. Secured With Compression Wrap Compression Stockings Add-Ons Wound #5 (Upper Leg) Wound Laterality: Right, Medial Cleanser Soap and Water Discharge Instruction: May shower and wash wound with dial antibacterial soap and water prior to dressing change. Wound Cleanser Discharge Instruction: Cleanse the wound with wound cleanser prior to applying a clean dressing using gauze sponges, not tissue or cotton balls. Peri-Wound Care Topical Primary Dressing Maxorb Extra Ag+ Alginate Dressing, 4x4.75 (in/in) Discharge Instruction: Apply to wound bed as instructed Secondary Dressing ALLEVYN Gentle Border, 5x5 (in/in) Discharge Instruction: Apply over primary dressing as directed. Secured With Compression Wrap Compression Stockings Add-Ons Wound #6 (Upper Leg) Wound Laterality: Left, Medial Cleanser Soap and Water Discharge Instruction: May shower and wash wound with dial antibacterial soap and water prior to dressing change. Wound Cleanser Discharge Instruction: Cleanse the wound with wound cleanser prior to applying a clean dressing using gauze sponges, not tissue or cotton balls. Peri-Wound Care [place], [name] ([id number]) [id number] Page 7 of 15 Topical Primary Dressing Maxorb Extra Ag+ Alginate Dressing, 4x4.75 (in/in) Discharge Instruction: Apply to wound bed as instructed Secondary Dressing ALLEVYN Gentle Border, 5x5 (in/in) Discharge Instruction: Apply over primary dressing as directed. Secured With Compression Wrap Compression Stockings Add-Ons Wound #7 (Pubis) Wound Laterality: Left Cleanser Soap and Water Discharge Instruction: May shower and wash wound with dial antibacterial soap and  water prior to dressing change. Wound Cleanser Discharge Instruction: Cleanse the wound with wound cleanser prior to applying a clean dressing using gauze sponges, not tissue or cotton balls. Peri-Wound Care Topical Primary Dressing Maxorb Extra Ag+ Alginate Dressing, 4x4.75 (in/in) Discharge Instruction: Apply to wound bed as instructed Secondary Dressing ALLEVYN Gentle Border, 5x5 (in/in) Discharge Instruction: Apply over primary dressing as directed. Secured With Compression Wrap Compression Stockings Facilities manager) Signed: 07/16/2022 9:50:23 AM By: Duanne Guess MD FACS Entered By: Duanne Guess on 07/16/2022 09:50:23 -------------------------------------------------------------------------------- Multi-Disciplinary Care Plan Details Patient Name: Date of Service: [facility] Stone 07/16/2022 8:00 A M Medical Record Number: [id number] Patient Account Number: [id number] Date of Birth/Sex: Treating RN: 02/11/2001 (21 y.o. Female) Karie Schwalbe Primary Care Jaxton Casale: Georgette Shell Other Clinician: Referring Jammie Troup: Treating Lizzeth Meder/Extender: Cassell Clement in Treatment: 0 Active Inactive Electronic Signature(s) Signed: 07/16/2022 3:59:59 PM By: Karie Schwalbe RN Entered By: Karie Schwalbe on 07/16/2022 09:42:34 [name] ([id number]) [id number] Page 8 of 15 -------------------------------------------------------------------------------- Pain Assessment Details Patient Name: Date of Service: Angie Stone 07/16/2022 8:00 A M Medical Record Number: [id number] Patient Account Number: [id number] Date of Birth/Sex: Treating RN: [birth year] (21 y.o. Female) Karie Schwalbe Primary Care Mekenzie Modeste: Georgette Shell Other Clinician: Referring Gavon Majano: Treating Jairo Bellew/Extender: Cassell Clement in Treatment: 0 Active Problems Location of Pain Severity and Description of  Pain Patient Has Paino Yes Site Locations Pain Location: Generalized Pain With Dressing Change: Yes Duration of the Pain. Constant / Intermittento Constant Rate the pain. Current Pain Level: 7 Worst Pain Level: 10 Least Pain Level: 3 Tolerable Pain Level: 3 Character of Pain Describe the Pain: Tender Pain Management and Medication Current Pain  Management: Medication: No Cold Application: No Rest: No Massage: No Activity: No T.E.N.S.: No Heat Application: No Leg drop or elevation: No Is the Current Pain Management Adequate: Adequate How does your wound impact your activities of daily livingo Sleep: No Bathing: No Appetite: No Relationship With Others: No Bladder Continence: No Emotions: No Bowel Continence: No Work: No Toileting: No Drive: No Dressing: No Hobbies: No Electronic Signature(s) Signed: 07/16/2022 3:59:59 PM By: Karie Schwalbe RN Entered By: Karie Schwalbe on 07/16/2022 09:04:14 -------------------------------------------------------------------------------- Patient/Caregiver Education Details Patient Name: Date of Service: [facility] 4/16/2024andnbsp8:00 A M Medical Record Number: [id number] Patient Account Number: [id number] Date of Birth/Gender: Treating RN: 10-15-2000 (21 y.o. Female) Karie Schwalbe Primary Care Physician: Georgette Shell Other Clinician: Referring Physician: Treating Physician/Extender: Cassell Clement in Treatment: 0 Education Assessment Education Provided To: Patient MERINDA, VICTORINO ([id number]) [id number] Page 9 of 15 Education Topics Provided Wound/Skin Impairment: Methods: Explain/Verbal Responses: Return demonstration correctly Electronic Signature(s) Signed: 07/16/2022 3:59:59 PM By: Karie Schwalbe RN Entered By: Karie Schwalbe on 07/16/2022 09:42:44 -------------------------------------------------------------------------------- Wound Assessment  Details Patient Name: Date of Service: [facility] Stone 07/16/2022 8:00 A M Medical Record Number: [id number] Patient Account Number: [id number] Date of Birth/Sex: Treating RN: 2000-05-26 (21 y.o. Female) Karie Schwalbe Primary Care Kycen Spalla: Georgette Shell Other Clinician: Referring Deshanda Molitor: Treating Cailey Trigueros/Extender: Cassell Clement in Treatment: 0 Wound Status Wound Number: 1 Primary Etiology: Hidradenitis Wound Location: Left Axilla Wound Status: Open Wounding Event: Other Lesion Date Acquired: 02/20/2022 Weeks Of Treatment: 0 Clustered Wound: Yes Photos Wound Measurements Length: (cm) 6 Width: (cm) 4 Depth: (cm) 0.1 Area: (cm) 18.85 Volume: (cm) 1.885 % Reduction in Area: % Reduction in Volume: Epithelialization: None Tunneling: No Undermining: No Wound Description Classification: Full Thickness Without Exposed Support Exudate Amount: Medium Exudate Type: Serosanguineous Exudate Color: red, brown Structures Foul Odor After Cleansing: No Slough/Fibrino Yes Wound Bed Granulation Amount: Medium (34-66%) Exposed Structure Granulation Quality: Red Fat Layer (Subcutaneous Tissue) Exposed: Yes Necrotic Amount: Medium (34-66%) Necrotic Quality: Adherent Slough Periwound Skin Texture Texture Color No Abnormalities Noted: No No Abnormalities Noted: Yes Scarring: Yes Temperature / Pain Temperature: No Abnormality Moisture No Abnormalities Noted: Yes Electronic Signature(s) JUSTISS, GERBINO ([id number]) [id number] Page 10 of 15 Signed: 07/16/2022 12:57:04 PM By: Dayton Scrape Signed: 07/16/2022 3:59:59 PM By: Karie Schwalbe RN Entered By: Dayton Scrape on 07/16/2022 08:41:29 -------------------------------------------------------------------------------- Wound Assessment Details Patient Name: Date of Service: [facility] Stone 07/16/2022 8:00 A M Medical Record Number: [id number] Patient Account Number: [id number] Date  of Birth/Sex: Treating RN: 07/31/2000 (21 y.o. Female) Karie Schwalbe Primary Care Jaquari Reckner: Georgette Shell Other Clinician: Referring December Hedtke: Treating Gwendolyne Welford/Extender: Cassell Clement in Treatment: 0 Wound Status Wound Number: 2 Primary Etiology: Hidradenitis Wound Location: Right Axilla Wound Status: Open Wounding Event: Other Lesion Date Acquired: 02/20/2022 Weeks Of Treatment: 0 Clustered Wound: Yes Photos Wound Measurements Length: (cm) 5 Width: (cm) 3 Depth: (cm) 0.1 Area: (cm) 11.781 Volume: (cm) 1.178 % Reduction in Area: % Reduction in Volume: Tunneling: No Undermining: No Wound Description Classification: Full Thickness Without Exposed Support Structures Foul Odor After Cleansing: No Slough/Fibrino Yes Wound Bed Granulation Amount: Large (67-100%) Exposed Structure Granulation Quality: Red Fascia Exposed: No Necrotic Amount: Small (1-33%) Fat Layer (Subcutaneous Tissue) Exposed: No Necrotic Quality: Adherent Slough Tendon Exposed: No Muscle Exposed: No Joint Exposed: No Bone Exposed: No Periwound Skin Texture Texture Color No Abnormalities Noted: No No Abnormalities Noted: Yes Scarring: Yes Temperature / Pain Temperature: No Abnormality Moisture No  Abnormalities Noted: Yes Electronic Signature(s) Signed: 07/16/2022 12:57:04 PM By: Dayton Scrape Signed: 07/16/2022 3:59:59 PM By: Karie Schwalbe RN Entered By: Dayton Scrape on 07/16/2022 08:42:09 [name] ([id number]) [id number] Page 11 of 15 -------------------------------------------------------------------------------- Wound Assessment Details Patient Name: Date of Service: Angie Stone 07/16/2022 8:00 A M Medical Record Number: [id number] Patient Account Number: [id number] Date of Birth/Sex: Treating RN: [birth year] (21 y.o. Female) Karie Schwalbe Primary Care Chantele Corado: Georgette Shell Other Clinician: Referring Damiean Lukes: Treating  Meri Pelot/Extender: Cassell Clement in Treatment: 0 Wound Status Wound Number: 3 Primary Etiology: Hidradenitis Wound Location: Right, Medial Breast Wound Status: Open Wounding Event: Other Lesion Date Acquired: 02/20/2022 Weeks Of Treatment: 0 Clustered Wound: Yes Photos Wound Measurements Length: (cm) 5 Width: (cm) 9 Depth: (cm) 0.1 Area: (cm) 35.343 Volume: (cm) 3.534 % Reduction in Area: % Reduction in Volume: Epithelialization: Small (1-33%) Tunneling: No Undermining: No Wound Description Classification: Full Thickness Without Exposed Support Structures Wound Bed Granulation Amount: Small (1-33%) Exposed Structure Granulation Quality: Red Fascia Exposed: No Necrotic Amount: Large (67-100%) Fat Layer (Subcutaneous Tissue) Exposed: Yes Necrotic Quality: Adherent Slough Tendon Exposed: No Muscle Exposed: No Joint Exposed: No Bone Exposed: No Periwound Skin Texture Texture Color No Abnormalities Noted: No No Abnormalities Noted: Yes Scarring: Yes Temperature / Pain Temperature: No Abnormality Moisture No Abnormalities Noted: Yes Electronic Signature(s) Signed: 07/16/2022 12:57:04 PM By: Dayton Scrape Signed: 07/16/2022 3:59:59 PM By: Karie Schwalbe RN Entered By: Dayton Scrape on 07/16/2022 08:42:39 -------------------------------------------------------------------------------- Wound Assessment Details Patient Name: Date of Service: [facility] Stone 07/16/2022 8:00 A M Medical Record Number: [id number] Patient Account Number: [id number] Date of Birth/Sex: Treating RN: 23-Jan-2001 (21 y.o. Female) Karie Schwalbe Primary Care Ailed Defibaugh: Georgette Shell Other Clinician: [name] ([id number]) [id number] Page 12 of 15 Referring Azalea Cedar: Treating Cynthya Yam/Extender: Cassell Clement in Treatment: 0 Wound Status Wound Number: 4 Primary Etiology: Hidradenitis Wound Location: Left, Medial  Breast Wound Status: Open Wounding Event: Other Lesion Date Acquired: 02/20/2022 Weeks Of Treatment: 0 Clustered Wound: Yes Photos Wound Measurements Length: (cm) 6 Width: (cm) 5 Depth: (cm) 0.1 Area: (cm) 23.562 Volume: (cm) 2.356 % Reduction in Area: % Reduction in Volume: Epithelialization: None Tunneling: No Undermining: No Wound Description Classification: Full Thickness Without Exposed Suppor Exudate Amount: Medium Exudate Type: Serosanguineous Exudate Color: red, brown t Structures Foul Odor After Cleansing: No Slough/Fibrino Yes Wound Bed Granulation Amount: Large (67-100%) Exposed Structure Granulation Quality: Red Fascia Exposed: No Necrotic Amount: Small (1-33%) Fat Layer (Subcutaneous Tissue) Exposed: Yes Necrotic Quality: Adherent Slough Tendon Exposed: No Muscle Exposed: No Joint Exposed: No Bone Exposed: No Periwound Skin Texture Texture Color No Abnormalities Noted: No No Abnormalities Noted: Yes Scarring: Yes Temperature / Pain Temperature: No Abnormality Moisture No Abnormalities Noted: Yes Electronic Signature(s) Signed: 07/16/2022 12:57:04 PM By: Dayton Scrape Signed: 07/16/2022 3:59:59 PM By: Karie Schwalbe RN Entered By: Dayton Scrape on 07/16/2022 08:43:22 -------------------------------------------------------------------------------- Wound Assessment Details Patient Name: Date of Service: [facility] Stone 07/16/2022 8:00 A M Medical Record Number: [id number] Patient Account Number: [id number] Date of Birth/Sex: Treating RN: 07-26-2000 (21 y.o. Female) Karie Schwalbe Primary Care Samaya Boardley: Georgette Shell Other Clinician: Referring Monik Lins: Treating Neville Pauls/Extender: Higinio Roger Weeks in Treatment: 0 Wound Status [name] ([id number]) [id number] Page 13 of 15 Wound Number: 5 Primary Etiology: Hidradenitis Wound Location: Right, Medial Upper Leg Wound Status: Open Wounding Event:  Other Lesion Date Acquired: 02/20/2022 Weeks Of Treatment: 0 Clustered Wound: Yes Photos Wound Measurements Length: (cm) 0.1 Width: (cm) 0.1  Depth: (cm) 0.1 Area: (cm) 0.008 Volume: (cm) 0.001 % Reduction in Area: % Reduction in Volume: Epithelialization: None Tunneling: No Undermining: No Wound Description Classification: Full Thickness Without Exposed Support Exudate Amount: Medium Exudate Type: Serosanguineous Exudate Color: red, brown Structures Foul Odor After Cleansing: No Slough/Fibrino Yes Wound Bed Granulation Amount: Medium (34-66%) Exposed Structure Granulation Quality: Red Fascia Exposed: No Necrotic Amount: Medium (34-66%) Fat Layer (Subcutaneous Tissue) Exposed: No Necrotic Quality: Adherent Slough Tendon Exposed: No Muscle Exposed: No Joint Exposed: No Bone Exposed: No Periwound Skin Texture Texture Color No Abnormalities Noted: No No Abnormalities Noted: Yes Scarring: Yes Temperature / Pain Temperature: No Abnormality Moisture No Abnormalities Noted: Yes Electronic Signature(s) Signed: 07/16/2022 12:57:04 PM By: Dayton Scrape Signed: 07/16/2022 3:59:59 PM By: Karie Schwalbe RN Entered By: Dayton Scrape on 07/16/2022 08:44:08 -------------------------------------------------------------------------------- Wound Assessment Details Patient Name: Date of Service: [facility] Stone 07/16/2022 8:00 A M Medical Record Number: [id number] Patient Account Number: [id number] Date of Birth/Sex: Treating RN: [birth year] (21 y.o. Female) Karie Schwalbe Primary Care Jasmaine Rochel: Georgette Shell Other Clinician: Referring Aasim Restivo: Treating Chace Klippel/Extender: Higinio Roger Weeks in Treatment: 0 Wound Status Wound Number: 6 Primary Etiology: Hidradenitis Wound Location: Left, Medial Upper Leg Wound Status: Open Wounding Event: Other Lesion Date Acquired: 02/20/2022 [facility] Of Treatment: 0 [name] ([id number])  [id number] Page 14 of 15 Clustered Wound: Yes Photos Wound Measurements Length: (cm) 7 Width: (cm) 0.1 Depth: (cm) 0.1 Area: (cm) 0.55 Volume: (cm) 0.055 % Reduction in Area: % Reduction in Volume: Tunneling: No Undermining: No Wound Description Classification: Full Thickness Without Exposed Support Exudate Amount: Medium Exudate Type: Serosanguineous Exudate Color: red, brown Structures Foul Odor After Cleansing: No Slough/Fibrino Yes Wound Bed Granulation Amount: Medium (34-66%) Exposed Structure Granulation Quality: Red Fascia Exposed: No Necrotic Amount: Medium (34-66%) Fat Layer (Subcutaneous Tissue) Exposed: No Necrotic Quality: Adherent Slough Tendon Exposed: No Muscle Exposed: No Joint Exposed: No Bone Exposed: No Periwound Skin Texture Texture Color No Abnormalities Noted: No No Abnormalities Noted: Yes Scarring: Yes Temperature / Pain Temperature: No Abnormality Moisture No Abnormalities Noted: Yes Electronic Signature(s) Signed: 07/16/2022 12:57:04 PM By: Dayton Scrape Signed: 07/16/2022 3:59:59 PM By: Karie Schwalbe RN Entered By: Dayton Scrape on 07/16/2022 08:44:35 -------------------------------------------------------------------------------- Wound Assessment Details Patient Name: Date of Service: [facility] Stone 07/16/2022 8:00 A M Medical Record Number: [id number] Patient Account Number: [id number] Date of Birth/Sex: Treating RN: May 24, 2000 (21 y.o. Female) Karie Schwalbe Primary Care Debarah Mccumbers: Georgette Shell Other Clinician: Referring Charnell Peplinski: Treating Jakyron Fabro/Extender: Higinio Roger Weeks in Treatment: 0 Wound Status Wound Number: 7 Primary Etiology: Hidradenitis Wound Location: Left Pubis Wound Status: Open Wounding Event: Other Lesion Date Acquired: 02/20/2022 Weeks Of Treatment: 0 Clustered Wound: Yes Photos MILLIANNA, SZYMBORSKI ([id number]) [id number] Page 15 of  15 Wound Measurements Length: (cm) 0.3 Width: (cm) 0.2 Depth: (cm) 0.1 Area: (cm) 0.047 Volume: (cm) 0.005 % Reduction in Area: % Reduction in Volume: Epithelialization: None Tunneling: No Undermining: No Wound Description Classification: Full Thickness Without Exposed Support Structures Exudate Amount: Medium Exudate Type: Serosanguineous Exudate Color: red, brown Foul Odor After Cleansing: No Slough/Fibrino Yes Wound Bed Granulation Amount: Large (67-100%) Exposed Structure Granulation Quality: Red Fascia Exposed: No Necrotic Amount: Small (1-33%) Fat Layer (Subcutaneous Tissue) Exposed: Yes Necrotic Quality: Adherent Slough Tendon Exposed: No Muscle Exposed: No Joint Exposed: No Bone Exposed: No Periwound Skin Texture Texture Color No Abnormalities Noted: No No Abnormalities Noted: Yes Scarring: Yes Temperature / Pain Temperature: No Abnormality Moisture No Abnormalities Noted: Yes Electronic Signature(s) Signed:  07/16/2022 12:57:04 PM By: Dayton Scrape Signed: 07/16/2022 3:59:59 PM By: Karie Schwalbe RN Entered By: Dayton Scrape on 07/16/2022 08:45:07 -------------------------------------------------------------------------------- Vitals Details Patient Name: Date of Service: [facility] Stone 07/16/2022 8:00 A M Medical Record Number: [id number] Patient Account Number: [id number] Date of Birth/Sex: Treating RN: 2000/10/03 (21 y.o. Female) Primary Care Marland Reine: Georgette Shell Other Clinician: Referring Raseel Jans: Treating Larrisha Babineau/Extender: Cassell Clement in Treatment: 0 Vital Signs Time Taken: 08:07 Temperature (F): 98.6 Height (in): 59 Pulse (bpm): 73 Weight (lbs): 253 Respiratory Rate (breaths/min): 20 Body Mass Index (BMI): 51.1 Blood Pressure (mmHg): 123/92 Reference Range: 80 - 120 mg / dl Electronic Signature(s) Signed: 07/16/2022 12:57:04 PM By: Dayton Scrape Entered By: Dayton Scrape on 07/16/2022 08:10:06

## 2022-07-16 NOTE — Progress Notes (Signed)
Angie, Stone ([id number]) [id number] Page 1 of 4 Visit Report for 07/16/2022 Abuse Risk Screen Details Patient Name: Date of Service: [facility] Stone 07/16/2022 8:00 A M Medical Record Number: [id number] Patient Account Number: [id number] Date of Birth/Sex: Treating RN: 27-Sep-2000 (22 y.o. Female) Karie Schwalbe Primary Care Tanyla Stege: Georgette Shell Other Clinician: Referring Nanette Wirsing: Treating Elfa Wooton/Extender: Cassell Clement in Treatment: 0 Abuse Risk Screen Items Answer ABUSE RISK SCREEN: Has anyone close to you tried to hurt or harm you recentlyo No Do you feel uncomfortable with anyone in your familyo No Has anyone forced you do things that you didnt want to doo No Electronic Signature(s) Signed: 07/16/2022 3:59:59 PM By: Karie Schwalbe RN Entered By: Karie Schwalbe on 07/16/2022 08:57:22 -------------------------------------------------------------------------------- Activities of Daily Living Details Patient Name: Date of Service: [facility] Stone 07/16/2022 8:00 A M Medical Record Number: [id number] Patient Account Number: [id number] Date of Birth/Sex: Treating RN: 29-May-2000 (22 y.o. Female) Karie Schwalbe Primary Care Mela Perham: Georgette Shell Other Clinician: Referring Mercadez Heitman: Treating Britney Newstrom/Extender: Cassell Clement in Treatment: 0 Activities of Daily Living Items Answer Activities of Daily Living (Please select one for each item) Drive Automobile Completely Able T Medications ake Completely Able Use T elephone Completely Able Care for Appearance Completely Able Use T oilet Completely Able Bath / Shower Completely Able Dress Self Completely Able Feed Self Completely Able Walk Completely Able Get In / Out Bed Completely Able Housework Completely Able Prepare Meals Completely Able Handle Money Completely Able Shop for Self Completely Able Electronic Signature(s) Signed:  07/16/2022 3:59:59 PM By: Karie Schwalbe RN Entered By: Karie Schwalbe on 07/16/2022 08:57:52 -------------------------------------------------------------------------------- Education Screening Details Patient Name: Date of Service: [facility] Stone 07/16/2022 8:00 A M Medical Record Number: [id number] Patient Account Number: [id number] Date of Birth/Sex: Treating RN: Jan 05, 2001 (22 y.o. Female) Karie Schwalbe Primary Care Khristopher Kapaun: Georgette Shell Other Clinician: Referring Alysa Duca: Treating Amma Crear/Extender: Cassell Clement in Treatment: 0 [place], [name] ([id number]) [id number].pdf Page 2 of 4 Primary Learner Assessed: Patient Learning Preferences/Education Level/Primary Language Highest Education Level: College or Above Preferred Language: Economist Language Barrier: No Translator Needed: No Memory Deficit: No Emotional Barrier: No Cultural/Religious Beliefs Affecting Medical Care: No Physical Barrier Impaired Vision: No Impaired Hearing: No Decreased Hand dexterity: No Knowledge/Comprehension Knowledge Level: High Comprehension Level: High Ability to understand written instructions: High Ability to understand verbal instructions: High Motivation Anxiety Level: Calm Cooperation: Cooperative Education Importance: Acknowledges Need Interest in Health Problems: Asks Questions Perception: Coherent Willingness to Engage in Self-Management High Activities: Readiness to Engage in Self-Management High Activities: Electronic Signature(s) Signed: 07/16/2022 3:59:59 PM By: Karie Schwalbe RN Entered By: Karie Schwalbe on 07/16/2022 09:03:37 -------------------------------------------------------------------------------- Fall Risk Assessment Details Patient Name: Date of Service: [facility] 07/16/2022 8:00 A M Medical Record Number: [id number] Patient Account Number: [id number] Date of Birth/Sex:  Treating RN: [birth year] (22 y.o. Female) Karie Schwalbe Primary Care Breezy Hertenstein: Georgette Shell Other Clinician: Referring Viyan Rosamond: Treating Kimberely Mccannon/Extender: Cassell Clement in Treatment: 0 Fall Risk Assessment Items Have you had 2 or more falls in the last 12 monthso 0 No Have you had any fall that resulted in injury in the last 12 monthso 0 No FALLS RISK SCREEN History of falling - immediate or within 3 months 0 No Secondary diagnosis (Do you have 2 or more medical diagnoseso) 0 No Ambulatory aid None/bed rest/wheelchair/nurse 0 No Crutches/cane/walker 0 No Furniture 0 No Intravenous therapy Access/Saline/Heparin Lock 0 No Gait/Transferring  Normal/ bed rest/ wheelchair 0 No Weak (short steps with or without shuffle, stooped but able to lift head while walking, may seek 0 No support from furniture) Impaired (short steps with shuffle, may have difficulty arising from chair, head down, impaired 0 No balance) Mental Status Oriented to own ability 0 No Overestimates or forgets limitations 0 No Risk Level: Low Risk Score: 0 Stone, Angie ([id number]) [id number].pdf Page 3 of 4 Electronic Signature(s) -------------------------------------------------------------------------------- Foot Assessment Details Patient Name: Date of Service: [facility]ie Stone 07/16/2022 8:00 A M Medical Record Number: [id number] Patient Account Number: [id number] Date of Birth/Sex: Treating RN: [birth year] (22 y.o. Female) Karie Schwalbe Primary Care Lilou Kneip: Georgette Shell Other Clinician: Referring Carsyn Taubman: Treating Mariea Mcmartin/Extender: Cassell Clement in Treatment: 0 Foot Assessment Items Site Locations + = Sensation present, - = Sensation absent, C = Callus, U = Ulcer R = Redness, W = Warmth, M = Maceration, PU = Pre-ulcerative lesion F = Fissure, S = Swelling, D = Dryness Assessment Right: Left: Other Deformity: No  No Prior Foot Ulcer: No No Prior Amputation: No No Charcot Joint: No No Ambulatory Status: Ambulatory Without Help Gait: Steady Electronic Signature(s) Signed: 07/16/2022 3:59:59 PM By: Karie Schwalbe RN Entered By: Karie Schwalbe on 07/16/2022 09:04:00 -------------------------------------------------------------------------------- Nutrition Risk Screening Details Patient Name: Date of Service: [facility]ie Stone 07/16/2022 8:00 A M Medical Record Number: [id number] Patient Account Number: [id number] Date of Birth/Sex: Treating RN: 02/23/2001 (22 y.o. Female) Karie Schwalbe Primary Care Taron Mondor: Georgette Shell Other Clinician: Referring Nickey Kloepfer: Treating Anjalee Cope/Extender: Cassell Clement in Treatment: 0 Height (in): 59 Weight (lbs): 253 Body Mass Index (BMI): 51.1 Warrell, Joanne ([id number]) [id number].pdf Page 4 of 4 Nutrition Risk Screening Items Score Screening NUTRITION RISK SCREEN: I have an illness or condition that made me change the kind and/or amount of food I eat 0 No I eat fewer than two meals per day 0 No I eat few fruits and vegetables, or milk products 0 No I have three or more drinks of beer, liquor or wine almost every day 0 No I have tooth or mouth problems that make it hard for me to eat 0 No I don't always have enough money to buy the food I need 0 No I eat alone most of the time 0 No I take three or more different prescribed or over-the-counter drugs a day 0 No Without wanting to, I have lost or gained 10 pounds in the last six months 0 No I am not always physically able to shop, cook and/or feed myself 0 No Nutrition Protocols Good Risk Protocol 0 No interventions needed Moderate Risk Protocol High Risk Proctocol Risk Level: Good Risk Score: 0 Electronic Signature(s) Signed: 07/16/2022 3:59:59 PM By: Karie Schwalbe RN Entered By: Karie Schwalbe on 07/16/2022 09:03:50

## 2022-07-23 ENCOUNTER — Encounter (HOSPITAL_BASED_OUTPATIENT_CLINIC_OR_DEPARTMENT_OTHER): Payer: Medicaid Other | Admitting: General Surgery

## 2022-07-25 ENCOUNTER — Other Ambulatory Visit: Payer: Self-pay | Admitting: Internal Medicine

## 2022-07-25 DIAGNOSIS — M0880 Other juvenile arthritis, unspecified site: Secondary | ICD-10-CM

## 2022-07-25 NOTE — Telephone Encounter (Signed)
Last Fill: 04/25/2022  Labs: 06/21/2022 BMP: Glucose 106, Calcium 8.7 07/02/2022 CBC: Hemoglobin 11.0, HCT 33.9, Absolute Monocytes 961 01/01/2022 Lipid Panel: LDL Chol Calc 136  TB Gold: 01/08/2022  Negative  Next Visit: 09/25/2022  Last Visit: 07/02/2022  [id number] rheumatoid arthritis   Current Dose per office note 07/02/2022: rinvoq 15 mg PO daily   Contacted patient to let her know she is due to update her lipid panel lab and advised patient of the walk in lab hours.   Okay to refill Rinvoq?

## 2022-07-29 ENCOUNTER — Telehealth: Payer: Self-pay | Admitting: Pharmacist

## 2022-07-29 NOTE — Telephone Encounter (Signed)
Patient due for PA renewal for Rinvoq. Submitted a Prior Authorization request to [facility] MEDICAID for [facility] via CoverMyMeds. Will update once we receive a response.  Key: Marguerita Merles, PharmD, MPH, BCPS, CPP Clinical Pharmacist (Rheumatology and Pulmonology)

## 2022-07-29 NOTE — Telephone Encounter (Signed)
Received notification from [facility] MEDICAID regarding a prior authorization for [facility]. Authorization has been APPROVED from 07/29/22 to 07/29/23. Approval letter sent to scan center.  Authorization # [id number]  Chesley Mires, PharmD, MPH, BCPS, CPP Clinical Pharmacist (Rheumatology and Pulmonology)

## 2022-08-06 ENCOUNTER — Other Ambulatory Visit (HOSPITAL_COMMUNITY): Payer: Self-pay

## 2022-08-09 ENCOUNTER — Other Ambulatory Visit: Payer: Self-pay

## 2022-08-10 ENCOUNTER — Other Ambulatory Visit: Payer: Self-pay | Admitting: Dermatology

## 2022-08-29 ENCOUNTER — Ambulatory Visit: Payer: Medicaid Other | Admitting: Dermatology

## 2022-09-02 ENCOUNTER — Other Ambulatory Visit: Payer: Self-pay | Admitting: Dermatology

## 2022-09-02 MED ORDER — DOXYCYCLINE MONOHYDRATE 100 MG PO CAPS: ORAL_CAPSULE | ORAL | 0 refills | Status: DC

## 2022-09-03 ENCOUNTER — Ambulatory Visit
Admit: 2022-09-03 | Discharge: 2022-09-04 | Payer: PRIVATE HEALTH INSURANCE | Attending: Student in an Organized Health Care Education/Training Program | Primary: Student in an Organized Health Care Education/Training Program

## 2022-09-03 ENCOUNTER — Other Ambulatory Visit (HOSPITAL_COMMUNITY): Payer: Self-pay

## 2022-09-03 ENCOUNTER — Other Ambulatory Visit: Payer: Self-pay

## 2022-09-03 ENCOUNTER — Other Ambulatory Visit: Payer: Self-pay | Admitting: Internal Medicine

## 2022-09-03 DIAGNOSIS — Z79899 Other long term (current) drug therapy: Principal | ICD-10-CM

## 2022-09-03 DIAGNOSIS — L732 Hidradenitis suppurativa: Principal | ICD-10-CM

## 2022-09-03 MED ORDER — RIFAMPIN 300 MG CAPSULE
ORAL_CAPSULE | Freq: Two times a day (BID) | ORAL | 2 refills | 30.00000 days | Status: CN
Start: 2022-09-03 — End: ?

## 2022-09-03 MED ORDER — CLINDAMYCIN HCL 300 MG CAPSULE
ORAL_CAPSULE | Freq: Three times a day (TID) | ORAL | 21.00000 days | Status: CN
Start: 2022-09-03 — End: 2022-09-24

## 2022-09-03 MED ORDER — METRONIDAZOLE 500 MG TABLET
ORAL_TABLET | Freq: Three times a day (TID) | ORAL | 0 refills | 90.00000 days | Status: CP
Start: 2022-09-03 — End: ?

## 2022-09-03 MED ORDER — MOXIFLOXACIN 400 MG TABLET
ORAL_TABLET | Freq: Every day | ORAL | 0 refills | 90.00000 days | Status: CP
Start: 2022-09-03 — End: ?

## 2022-09-03 MED ORDER — COSENTYX SENSOREADY (300 MG) 150 MG/ML ~~LOC~~ SOAJ
SUBCUTANEOUS | 2 refills | Status: AC
  Filled 2022-09-03: qty 2, 28d supply, fill #0
  Filled 2022-09-30: qty 2, 28d supply, fill #1
  Filled 2022-10-30: qty 2, 28d supply, fill #2

## 2022-09-03 NOTE — Telephone Encounter (Signed)
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

## 2022-09-04 ENCOUNTER — Other Ambulatory Visit (HOSPITAL_COMMUNITY): Payer: Self-pay

## 2022-09-11 ENCOUNTER — Encounter: Payer: Self-pay | Admitting: Internal Medicine

## 2022-09-11 NOTE — Telephone Encounter (Signed)
This type of form would be best completed by a physical therapy functional capacity exam. I cannot give answers to this level of detail on specific tasks and abilities based on our clinic appointments. We can refer her for having this assessed if she has not already.  By the requested date I could provide only a more generic communication attesting to some of these questions.

## 2022-09-25 ENCOUNTER — Encounter: Payer: Self-pay | Admitting: Internal Medicine

## 2022-09-25 ENCOUNTER — Ambulatory Visit: Payer: Medicaid Other | Attending: Internal Medicine | Admitting: Internal Medicine

## 2022-09-25 VITALS — BP 130/89 | HR 105 | Resp 14 | Ht 59.0 in | Wt 223.0 lb

## 2022-09-25 DIAGNOSIS — Z79899 Other long term (current) drug therapy: Secondary | ICD-10-CM | POA: Diagnosis not present

## 2022-09-25 DIAGNOSIS — M0880 Other juvenile arthritis, unspecified site: Secondary | ICD-10-CM | POA: Diagnosis not present

## 2022-09-25 DIAGNOSIS — M0600 Rheumatoid arthritis without rheumatoid factor, unspecified site: Secondary | ICD-10-CM

## 2022-09-25 DIAGNOSIS — L732 Hidradenitis suppurativa: Secondary | ICD-10-CM | POA: Diagnosis not present

## 2022-09-25 NOTE — Progress Notes (Signed)
Office Visit Note  Patient: Angie Stone             Date of Birth: June 26, 2000           MRN: [id number]             PCP: Lucila Maine Referring: Teena Irani, PA-C Visit Date: 09/25/2022   Subjective:  Follow-up   History of Present Illness: Angie Stone is a 22 y.o. 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 daily.  Since her last visit she has not experienced any severe flareups of rheumatoid arthritis but does intermittently experience swelling most often in the knees and feet and joint pain in these areas as well as hands and wrists.  Wrist pain has been worse use related as she has been pushing off more to assist with standing and getting up from low positions due to some groin pain from hidradenitis lesions.  She saw her new dermatologist earlier this month and was prescribed moxifloxacin and metronidazole for active disease so far cyst remain but some decrease in the amount of redness and pain.  She has not required any extra steroid treatment due to symptom flareups since our last visit.  07/02/22 [name] is a 22 y.o. female here for follow up for RA with history of [name] and hidradeniitis suppurativa.   Since our last visit she stopped remicade and started with cosentyx injections du to worsening HS disease activity. Continued on rinvoq 15 mg PO daily. No problems with the injections and joints are overall doing okay. Right wrist pain and stiffness increased, lasting several hours each morning. Knees are doing somewhat better. Ankle pain is worse though, also with more pain on the bottom of the foot especially first thing in the morning. Cannot tolerate walking barefoot on wood floors now.  Has difficulty with any activity requiring raising arms above head even has difficulty managing her own wound care and hygiene of hidradenitis lesions  in the axillary regions.  Does have some difficulty with changing clothes due to her shoulders.  Skin disease is currently worst part of activity this has progressively worsened now with lesions scattered throughout from her neck down to the groin.  She has never had lesions involving her neck or into her arms more distant than the axillary region until the past few months.  She was restarted on doxycycline but so far not seem to be symptom improvement.  Seeing wound care for the extensive inflammation and multiple draining lesions. She was also recommended to see a specialist at [facility] dermatology.   Previous HPI 04/16/22 Angie Stone is a 22 y.o. female here for follow up for RA with history of [name] and hydradenitis suppurativa on remicade 1000 mg q6wks and rinvoq 15 mg daily. She saw her dermatologist Dr. Gwen Pounds recently and with ongoing HS inflammation recently concerning for decreased effectiveness of her treatment regimen. The wounds are partially improved today, several open lesions in axillae but no longer raised or draining fluid. Joint pain is slightly worse in the knees she associated with colder weather as there is no increase in swelling.   Previous HPI 03/14/22 Angie Stone is a 22 y.o. female here for follow up for rheumatoid arthritis with history of [name] on remicade infusion 1000 mg q6wks and rinvoq 15 mg daily.  Since her last visit she is doing overall okay but has  experienced some increase in left knee pain with small amount of joint swelling.  She is also having ongoing hidradenitis symptoms.  She completed most recently a course of Keflex for associated abscess with improvement.  Currently still has some drainage coming from the lesions under her breast.  Had recent follow-up with dermatology clinic Dr. Gwen Pounds currently on isotretinoin 40 mg daily recommends several more months treatment.   Previous HPI 12/11/21 Angie Stone is a 22 y.o. female here for follow up for  inflammatory arthritis with history of [name] on remicade infusion 1000 mg q6wks and rinvoq 15 mg daily.  She has not had any significant flareup of arthritis symptoms since her last visit.  She still has knee pain most commonly at nighttime not associated with significant swelling.  She continues to have a small area of skin numbness over decree sensation on the back of the right leg above the level of the knee.  She did have worsening hidradenitis lesion in the left axilla that required drainage and had delayed wound healing for about a month.  Before that have been about a year since requiring any aspiration or drainage for a chest.  Otherwise she remains on the low-dose doxycycline treatment.   Previous HPI 07/31/2021 Angie Stone is a 22 y.o. female here for follow up for inflammatory arthritis with history of [name] on remicade infusion 1000 mg q6wks and rinvoq 15 mg daily. Joint symptoms are mostly doing well she had some days with increased knee pain and stiffness, no visible swelling, warmth, or redness. She notices joint pain is worse in the last 5th or 6th week between remicade infusion. Right axilla with few open sores draining other areas under arms and breasts about the same. She saw dermatology yesterday with recommendation of oral doxycycline and topical cleansing agent for hidradenitis symptoms.   Previous HPI 03/20/21 Angie Stone is a 22 y.o. 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  Symptoms remain well controlled no joint pain swelling or skin disease activity.   Previous HPI 03/06/20 Angie Stone is a 22 y.o. 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 She is looking to transfer care to [facility] from [facility] and [place] due to proximity and transitioning to adult rheumatology clinic. Currently she feels symptoms are  very well controlled but she restarting the infliximab infusions round March due to symptoms increased on tofacitinib monotherapy.    She denies eye inflammation or visual acuity change. She does have recurrent skin lesions she describes as boils over the same area on her upper torso or axillary region. She denies cough, dyspnea, lymphadenopathy, oral ulcers, diarrhea, hematochezia, or alopecia.   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 She has no history of uveitis. She underwent bilateral hip arthroplasty in 2015.   Previous treatments: Tocilizumab, methotrexate ~ 2014 Orencia, Kineret, methotrexate 2014-2015, 2016 Remicade, methotrexate [place] 2016-2020 Sulfasalazine 2018-2019 not tolerated due to rash Leflunomide 2019 ineffective Tofacitinib 2019-current Infliximab resumed 05/2019 due to disease flare     Review of Systems  Constitutional:  Positive for fatigue.  HENT:  Negative for mouth sores and mouth dryness.   Eyes:  Negative for dryness.  Respiratory:  Negative for shortness of breath.   Cardiovascular:  Negative for chest pain and palpitations.  Gastrointestinal:  Positive for diarrhea. Negative for  blood in stool and constipation.  Endocrine: Negative for increased urination.  Genitourinary:  Negative for involuntary urination.  Musculoskeletal:  Positive for joint pain, gait problem, joint pain and morning stiffness. Negative for joint swelling, myalgias, muscle weakness, muscle tenderness and myalgias.  Skin:  Negative for color change, rash, hair loss and sensitivity to sunlight.  Allergic/Immunologic: Positive for susceptible to infections.  Neurological:  Negative for dizziness and headaches.  Hematological:  Negative for swollen glands.  Psychiatric/Behavioral:  Positive for sleep disturbance. Negative for depressed mood. The patient is not nervous/anxious.     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    Past Medical History:  Diagnosis Date   Immunosuppression (HCC)    [name] (juvenile idiopathic arthritis) (HCC)     Family History  Problem Relation Age of Onset   Diabetes Mother    Diabetes Maternal Grandmother    Arthritis Paternal Grandmother    Past Surgical History:  Procedure Laterality Date   JOINT REPLACEMENT Bilateral    Hips   Social History   Social History Narrative   Not on file   Immunization History  Administered Date(s) Administered   DTaP 01/18/2002, 04/22/2002, 06/03/2002, 08/01/2004, 11/08/2005   HIB (PRP-OMP) 08/01/2004   HIB, Unspecified 01/18/2002, 04/22/2002, 06/03/2002, 08/01/2004   Hep A, Unspecified 06/20/2005   Hep B, Unspecified May [birth year], 2001, 01/18/2002, 11/04/2002   Hepatitis A, Ped/Adol-2 Dose 06/20/2005, 11/25/2007   Hepatitis B, PED/ADOLESCENT 2000-09-23, 01/18/2002, 11/04/2002   IPV 01/18/2002, 04/22/2002, 11/04/2002, 11/08/2005   Influenza Nasal 01/09/2009   Influenza, Seasonal, Injecte, Preservative Fre 01/06/2015, 01/03/2016   Influenza,inj,Quad PF,6+ Mos 01/06/2015, 01/03/2016, 01/15/2017, 01/15/2017, 12/24/2018, 12/24/2018   Influenza-Unspecified 01/06/2015, 01/03/2016, 01/07/2018   MMR 11/04/2002, 11/08/2005   MMRV 11/08/2005   Meningococcal Acwy, Unspecified 11/27/2012   Moderna Sars-Covid-2 Vaccination 02/18/2020   PFIZER(Purple Top)SARS-COV-2 Vaccination 02/18/2020, 03/22/2020   Pneumococcal Conjugate PCV 7 01/18/2002, 04/22/2002, 06/03/2002, 08/01/2004   Pneumococcal Conjugate-13 01/18/2002, 04/22/2002,  06/03/2002, 08/01/2004   Tdap 12/07/2012   Varicella 11/04/2002, 11/08/2005     Objective: Vital Signs: BP 130/89 (BP Location: Left Arm, Patient Position: Sitting, Cuff Size: Normal)   Pulse (!) 105   Resp 14   Ht 4\' 11"  (1.499 m)   Wt 223 lb (101.2 kg)   BMI 45.04 kg/m    Physical Exam Constitutional:      Appearance: She is obese.  HENT:     Mouth/Throat:     Mouth: Mucous membranes are moist.     Pharynx: Oropharynx is clear.  Eyes:     Conjunctiva/sclera: Conjunctivae normal.  Cardiovascular:     Rate and Rhythm: Regular rhythm. Tachycardia present.  Pulmonary:     Effort: Pulmonary effort is normal.     Breath sounds: Normal breath sounds.  Skin:    General: Skin is warm and dry.     Findings: Rash present.  Neurological:     Mental Status: She is alert.  Psychiatric:        Mood and Affect: Mood normal.      Musculoskeletal Exam:  Shoulders full ROM no tenderness or swelling Elbows full ROM no tenderness or  swelling Bilateral wrist significantly restricted flexion and extension range of motion with hard endpoints, tenderness to pressure, no palpable effusions Right hand second third MCP joint pain more with tight grip then with direct pressure, feels slight radiation of pain into the hand and forearm Right thigh and lateral hip pain provoked with internal rotation, no lateral tenderness to pressure Knees mild joint line tenderness to pressure, ROM intact no palpable effusions Ankles full ROM no focal tenderness, trace overlying pedal edema   CDAI Exam: CDAI Score: 10  Patient Global: 30 / 100; Provider Global: 10 / 100 Swollen: 0 ; Tender: 6  Joint Exam 09/25/2022      Right  Left  Wrist   Tender   Tender  MCP 2   Tender     MCP 3   Tender     Knee   Tender   Tender     Investigation: No additional findings.  Imaging: No results found.  Recent Labs: Lab Results  Component Value Date   WBC 8.5 07/02/2022   HGB 11.0 (L) 07/02/2022   PLT  389 07/02/2022   NA 135 06/21/2022   K 3.5 06/21/2022   CL 104 06/21/2022   CO2 22 06/21/2022   GLUCOSE 106 (H) 06/21/2022   BUN 9 06/21/2022   CREATININE 0.81 06/21/2022   BILITOT 0.4 04/11/2022   ALKPHOS 69 04/11/2022   AST 20 04/11/2022   ALT 18 04/11/2022   PROT 7.9 04/11/2022   ALBUMIN 3.4 (L) 04/11/2022   CALCIUM 8.7 (L) 06/21/2022   GFRAA 144 09/13/2020   QFTBGOLDPLUS Negative 01/08/2022    Speciality Comments: Diagnosed in 2010. Combination of tocilizumab IV q2wk and MTX-initiation unclear-d/c 09/2012 Combination Orencia IV q4wk, Kineret sq daily, MTX-7/14-4/15 Tried injectable MTX 30 mg weekly-d/c SE and inefficacy  Remicade infusions 10 mg/kg every 4 weeks Tried SSZ in 2018 and again 2019-d/c both times due to rash [place]-05/16/17-01/06/18-Inadequate response Xeljanz started 01/08/18 Remicade stopped 04/11/22 Cosentyx started 05/23/22  Procedures:  No procedures performed Allergies: Sulfasalazine   Assessment / Plan:     Visit Diagnoses: Seronegative rheumatoid arthritis (HCC) - Plan: Sedimentation rate  Rheumatoid arthritis appears moderately well-controlled ongoing pain in multiple areas including hands wrists and knees but without much synovitis on exam today.  Will check sed rate again for disease activity monitoring.  Plan to continue on the Cosentyx subcu monthly and Rinvoq 15 mg daily.  Hidradenitis suppurativa  Ongoing skin inflammation recent treatment with doxycycline and moxifloxacin as well for continued activity.  No recurrent of specific cellulitis or abscess.  Switch to Cosentyx from Remicade originally due to lack of treatment response has now been about 4 months and clinically only slight improvement.  Despite significant mount of the persistent serum inflammatory markers related to the at bedtime.  Continues follow-up with Dr. Gwen Pounds.  High risk medication use - Plan: COMPLETE METABOLIC PANEL WITH GFR, CBC with Differential/Platelet  Checking CBC  and CMP for medication monitoring on continued use of Cosentyx and Rinvoq.  Most recent TB screen from October was negative.  Multiple recent antibiotics course but more related to at bedtime inflammation versus discrete soft tissue infection.    Orders: Orders Placed This Encounter  Procedures   Sedimentation rate   COMPLETE METABOLIC PANEL WITH GFR   CBC with Differential/Platelet   No orders of the defined types were placed in this encounter.    Follow-Up Instructions: Return in about 3 months (around 12/26/2022) for RA/HS on COS/UPA f/u 3mos.   Cristal Deer  Jeanine Luz, MD  Note - This record has been created using Animal nutritionist.  Chart creation errors have been sought, but may not always  have been located. Such creation errors do not reflect on  the standard of medical care.

## 2022-09-26 DIAGNOSIS — L732 Hidradenitis suppurativa: Principal | ICD-10-CM

## 2022-09-26 LAB — COMPLETE METABOLIC PANEL WITH GFR
AG Ratio: 0.8 (calc) — ABNORMAL LOW (ref 1.0–2.5)
ALT: 19 U/L (ref 6–29)
AST: 16 U/L (ref 10–30)
Albumin: 3.9 g/dL (ref 3.6–5.1)
Alkaline phosphatase (APISO): 90 U/L (ref 31–125)
BUN: 7 mg/dL (ref 7–25)
CO2: 24 mmol/L (ref 20–32)
Calcium: 9.7 mg/dL (ref 8.6–10.2)
Chloride: 104 mmol/L (ref 98–110)
Creat: 0.72 mg/dL (ref 0.50–0.96)
Globulin: 4.7 g/dL (calc) — ABNORMAL HIGH (ref 1.9–3.7)
Glucose, Bld: 75 mg/dL (ref 65–99)
Potassium: 4.1 mmol/L (ref 3.5–5.3)
Sodium: 138 mmol/L (ref 135–146)
Total Bilirubin: 0.4 mg/dL (ref 0.2–1.2)
Total Protein: 8.6 g/dL — ABNORMAL HIGH (ref 6.1–8.1)
eGFR: 123 mL/min/{1.73_m2} (ref >=60)

## 2022-09-26 LAB — CBC WITH DIFFERENTIAL/PLATELET
Absolute Monocytes: 760 cells/uL (ref 200–950)
Basophils Absolute: 28 cells/uL (ref 0–200)
Basophils Relative: 0.4 %
Eosinophils Absolute: 121 cells/uL (ref 15–500)
Eosinophils Relative: 1.7 %
HCT: 37.6 % (ref 35.0–45.0)
Hemoglobin: 12.3 g/dL (ref 11.7–15.5)
Lymphs Abs: 2038 cells/uL (ref 850–3900)
MCH: 27.9 pg (ref 27.0–33.0)
MCHC: 32.7 g/dL (ref 32.0–36.0)
MCV: 85.3 fL (ref 80.0–100.0)
MPV: 9.5 fL (ref 7.5–12.5)
Monocytes Relative: 10.7 %
Neutro Abs: 4154 cells/uL (ref 1500–7800)
Neutrophils Relative %: 58.5 %
Platelets: 314 10*3/uL (ref 140–400)
RBC: 4.41 10*6/uL (ref 3.80–5.10)
RDW: 14.9 % (ref 11.0–15.0)
Total Lymphocyte: 28.7 %
WBC: 7.1 10*3/uL (ref 3.8–10.8)

## 2022-09-26 LAB — SEDIMENTATION RATE: Sed Rate: >130 mm/h — ABNORMAL HIGH (ref 0–20)

## 2022-09-26 MED ORDER — CEFDINIR 300 MG CAPSULE
ORAL_CAPSULE | Freq: Two times a day (BID) | ORAL | 2 refills | 30.00000 days | Status: CP
Start: 2022-09-26 — End: ?

## 2022-09-30 ENCOUNTER — Encounter: Payer: Self-pay | Admitting: Dermatology

## 2022-09-30 ENCOUNTER — Other Ambulatory Visit: Payer: Self-pay

## 2022-10-01 ENCOUNTER — Other Ambulatory Visit (HOSPITAL_COMMUNITY): Payer: Self-pay

## 2022-10-01 ENCOUNTER — Ambulatory Visit: Payer: Medicaid Other | Admitting: Internal Medicine

## 2022-10-02 ENCOUNTER — Other Ambulatory Visit (HOSPITAL_COMMUNITY): Payer: Self-pay

## 2022-10-07 ENCOUNTER — Other Ambulatory Visit (HOSPITAL_COMMUNITY): Payer: Self-pay

## 2022-10-15 DIAGNOSIS — L732 Hidradenitis suppurativa: Principal | ICD-10-CM

## 2022-10-15 MED ORDER — PREDNISONE 10 MG TABLET
ORAL_TABLET | ORAL | 0 refills | 28.00000 days | Status: CP
Start: 2022-10-15 — End: 2022-11-12

## 2022-10-15 MED ORDER — MUPIROCIN 2 % TOPICAL OINTMENT
TOPICAL | 1 refills | 0.00000 days | Status: CP
Start: 2022-10-15 — End: ?

## 2022-10-23 ENCOUNTER — Ambulatory Visit: Payer: Medicaid Other | Admitting: Dermatology

## 2022-10-25 ENCOUNTER — Other Ambulatory Visit: Payer: Self-pay | Admitting: Internal Medicine

## 2022-10-25 DIAGNOSIS — M0880 Other juvenile arthritis, unspecified site: Secondary | ICD-10-CM

## 2022-10-30 ENCOUNTER — Other Ambulatory Visit (HOSPITAL_COMMUNITY): Payer: Self-pay

## 2022-11-20 ENCOUNTER — Other Ambulatory Visit: Payer: Self-pay

## 2022-11-21 ENCOUNTER — Ambulatory Visit
Admit: 2022-11-21 | Discharge: 2022-11-21 | Payer: PRIVATE HEALTH INSURANCE | Attending: Student in an Organized Health Care Education/Training Program | Primary: Student in an Organized Health Care Education/Training Program

## 2022-11-21 ENCOUNTER — Other Ambulatory Visit: Admit: 2022-11-21 | Discharge: 2022-11-21 | Payer: PRIVATE HEALTH INSURANCE

## 2022-11-21 DIAGNOSIS — Z79899 Other long term (current) drug therapy: Principal | ICD-10-CM

## 2022-11-21 DIAGNOSIS — L732 Hidradenitis suppurativa: Principal | ICD-10-CM

## 2022-11-21 MED ORDER — HUMIRA(CF) PEN 80 MG/0.8 ML SUBCUTANEOUS KIT
SUBCUTANEOUS | 0 refills | 0.00000 days | Status: CP
Start: 2022-11-21 — End: ?
  Filled 2022-11-28: qty 2, 28d supply, fill #0

## 2022-11-21 MED ORDER — HUMIRA PEN CITRATE FREE STARTER PACK FOR CROHN'S/UC/HS 3 X 80 MG/0.8 ML
PACK | 0 refills | 0.00000 days | Status: CN
Start: 2022-11-21 — End: ?

## 2022-11-21 MED ORDER — HUMIRA PEN CITRATE FREE 40 MG/0.4 ML
PACK | SUBCUTANEOUS | 1 refills | 42.00000 days | Status: CN
Start: 2022-11-21 — End: ?

## 2022-11-22 ENCOUNTER — Other Ambulatory Visit: Payer: Self-pay

## 2022-11-25 ENCOUNTER — Other Ambulatory Visit: Payer: Self-pay

## 2022-11-27 MED ORDER — EMPTY CONTAINER
2 refills | 0 days
Start: 2022-11-27 — End: ?

## 2022-11-27 NOTE — Unmapped (Signed)
[facility]   Patient Onboarding/Medication Counseling    Diana Brown is a 22 y.o. female with HS who I am counseling today on initiation of therapy.  I am speaking to the patient.    Was a Nurse, learning disability used for this call? No    Verified patient's date of birth / HIPAA.    Specialty medication(s) to be sent: Inflammatory Disorders: Humira      Non-specialty medications/supplies to be sent: sharps      Medications not needed at this time: n/a         Humira (adalimumab)    Medication & Administration     Dosage: Hidradenitis supprativa: Inject 160mg  under the skin on day 1, 80mg  on day 15, then 80mg  every 14 days starting on day 29    Lab tests required prior to treatment initiation:  Tuberculosis: Tuberculosis screening resulted in a non-reactive Quantiferon TB Gold assay.  Hepatitis B: Hepatitis B serology studies are complete and non-reactive.    Administration:     Prefilled auto-injector pen  1. Gather all supplies needed for injection on a clean, flat working surface: medication pen removed from packaging, alcohol swab, sharps container, etc.  2. Look at the medication label - look for correct medication, correct dose, and check the expiration date  3. Look at the medication - the liquid visible in the window on the side of the pen device should appear clear and colorless  4. Lay the auto-injector pen on a flat surface and allow it to warm up to room temperature for at least 30-45 minutes  5. Select injection site - you can use the front of your thigh or your belly (but not the area 2 inches around your belly button); if someone else is giving you the injection you can also use your upper arm in the skin covering your triceps muscle  6. Prepare injection site - wash your hands and clean the skin at the injection site with an alcohol swab and let it air dry, do not touch the injection site again before the injection  7. Pull the 2 safety caps straight off - gray/white to uncover the needle cover and the plum cap to uncover the plum activator button, do not remove until immediately prior to injection and do not touch the white needle cover  8. Gently squeeze the area of cleaned skin and hold it firmly to create a firm surface at the selected injection site  9. Put the white needle cover against your skin at the injection site at a 90 degree angle, hold the pen such that you can see the clear medication window  10. Press down and hold the pen firmly against your skin, press the plum activator button to initiate the injection, there will be a click when the injection starts  11. Continue to hold the pen firmly against your skin for about 10-15 seconds - the window will start to turn solid yellow  12. To verify the injection is complete after 10-15 seconds, look and ensure the window is solid yellow and then pull the pen away from your skin  13. Dispose of the used auto-injector pen immediately in your sharps disposal container the needle will be covered automatically  14. If you see any blood at the injection site, press a cotton ball or gauze on the site and maintain pressure until the bleeding stops, do not rub the injection site    Adherence/Missed dose instructions:  If your injection is given  more than 3 days after your scheduled injection date - consult your pharmacist for additional instructions on how to adjust your dosing schedule.    Goals of Therapy     - Reduce the frequency and severity of new lesions  - Minimize pain and suppuration  - Prevent disease progression and limit scarring  - Maintenance of effective psychosocial functioning    Side Effects & Monitoring Parameters     Injection site reaction (redness, irritation, inflammation localized to the site of administration)  Signs of a common cold - minor sore throat, runny or stuffy nose, etc.  Upset stomach  Headache    The following side effects should be reported to the provider:  Signs of a hypersensitivity reaction - rash; hives; itching; red, swollen, blistered, or peeling skin; wheezing; tightness in the chest or throat; difficulty breathing, swallowing, or talking; swelling of the mouth, face, lips, tongue, or throat; etc.  Reduced immune function - report signs of infection such as fever; chills; body aches; very bad sore throat; ear or sinus pain; cough; more sputum or change in color of sputum; pain with passing urine; wound that will not heal, etc.  Also at a slightly higher risk of some malignancies (mainly skin and blood cancers) due to this reduced immune function.  In the case of signs of infection - the patient should hold the next dose of Humira?? and call your primary care provider to ensure adequate medical care.  Treatment may be resumed when infection is treated and patient is asymptomatic.  Changes in skin - a new growth or lump that forms; changes in shape, size, or color of a previous mole or marking  Signs of unexplained bruising or bleeding - throwing up blood or emesis that looks like coffee grounds; black, tarry, or bloody stool; etc.  Signs of new or worsening heart failure - shortness of breath; sudden weight gain; heartbeat that is not normal; swelling in the arms or legs that is new or worse      Contraindications, Warnings, & Precautions     Have your bloodwork checked as you have been told by your prescriber  Talk with your doctor if you are pregnant, planning to become pregnant, or breastfeeding  Discuss the possible need for holding your dose(s) of Humira?? when a planned procedure is scheduled with the prescriber as it may delay healing/recovery timeline       Drug/Food Interactions     Medication list reviewed in Epic. The patient was instructed to inform the care team before taking any new medications or supplements. No drug interactions identified.   Talk with you prescriber or pharmacist before receiving any live vaccinations while taking this medication and after you stop taking it    Storage, Handling Precautions, & Disposal     Store this medication in the refrigerator.  Do not freeze  If needed, you may store at room temperature for up to 14 days  Store in original packaging, protected from light  Do not shake  Dispose of used syringes/pens in a sharps disposal container          Current Medications (including OTC/herbals), Comorbidities and Allergies     Current Outpatient Medications   Medication Sig Dispense Refill    adalimumab (HUMIRA,CF, PEN) 80 mg/0.8 mL PnKt Inject the contents of 2 pens (160 mg) on day 1, THEN 1 pen (80 mg) on day 15. 3 each 0    adalimumab (HUMIRA,CF, PEN) 80 mg/0.8 mL PnKt Inject the contents  of 1 pen (80 mg total) under the skin every fourteen (14) days. 2 each 11    cefdinir (OMNICEF) 300 MG capsule Take 1 capsule (300 mg total) by mouth two (2) times a day. 60 capsule 2    doxycycline (MONODOX) 100 MG capsule       metroNIDAZOLE (FLAGYL) 500 MG tablet Take 1 tablet (500 mg total) by mouth Three (3) times a day. 270 tablet 0    moxifloxacin (AVELOX) 400 mg tablet Take 1 tablet (400 mg total) by mouth daily. 90 tablet 0    mupirocin (BACTROBAN) 2 % ointment Apply topically up to three times a day to belly button for a week or until improved. 30 g 1    upadacitinib (RINVOQ) 15 mg Tb24 Take 15 mg by mouth daily.       No current facility-administered medications for this visit.       Allergies   Allergen Reactions    Sulfasalazine Rash, Hives and Itching       Patient Active Problem List   Diagnosis    Presence of both artificial hip joints    Personal history of immunosupression therapy    Polyarticular juvenile idiopathic arthritis (CMS-HCC)    High risk medication use    Nonintractable headache    Vitamin D deficiency       Reviewed and up to date in Epic.    Appropriateness of Therapy     Acute infections noted within Epic:  No active infections  Patient reported infection: None    Is the medication and dose appropriate based on diagnosis, medication list, comorbidities, allergies, medical history, patient???s ability to self-administer the medication, and therapeutic goals? Yes    Prescription has been clinically reviewed: Yes      Baseline Quality of Life Assessment      How many days over the past month did your HS  keep you from your normal activities? For example, brushing your teeth or getting up in the morning. frequently    Financial Information     Medication Assistance provided: Prior Authorization    Anticipated copay of $4 reviewed with patient. Verified delivery address.    Delivery Information     Scheduled delivery date: 8/30    Expected start date: 8/30      Medication will be delivered via UPS to the prescription address in [facility].  This shipment will not require a signature.      Explained the services we provide at [facility] and that each month we would call to set up refills.  Stressed importance of returning phone calls so that we could ensure they receive their medications in time each month.  Informed patient that we should be setting up refills 7-10 days prior to when they will run out of medication.  A pharmacist will reach out to perform a clinical assessment periodically.  Informed patient that a welcome packet, containing information about our pharmacy and other support services, a Notice of Privacy Practices, and a drug information handout will be sent.      The patient or caregiver noted above participated in the development of this care plan and knows that they can request review of or adjustments to the care plan at any time.      Patient or caregiver verbalized understanding of the above information as well as how to contact the pharmacy at [phone] option 4 with any questions/concerns.  The pharmacy is open Monday through Friday 8:30am-4:30pm.  A pharmacist is available 24/7 via pager to answer any clinical questions they may have.    Patient Specific Needs     Does the patient have any physical, cognitive, or cultural barriers? No    Does the patient have adequate living arrangements? (i.e. the ability to store and take their medication appropriately) Yes    Did you identify any home environmental safety or security hazards? No    Patient prefers to have medications discussed with  Patient     Is the patient or caregiver able to read and understand education materials at a high school level or above? Yes    Patient's primary language is  English     Is the patient high risk? No    SOCIAL DETERMINANTS OF HEALTH     At the [facility], we have learned that life circumstances - like trouble affording food, housing, utilities, or transportation can affect the health of many of our patients.   That is why we wanted to ask: are you currently experiencing any life circumstances that are negatively impacting your health and/or quality of life? Patient declined to answer    Social Determinants of Health     Financial Resource Strain: Patient Declined (07/15/2022)    Received from [facility], [facility]    Overall Financial Resource Strain (CARDIA)     Difficulty of Paying Living Expenses: Patient declined   Internet Connectivity: Not on file   Food Insecurity: Low Risk  (10/11/2022)    Received from [facility]    Food vital sign     Within the past 12 months, you worried that your food would run out before you got money to buy more: Never true     Within the past 12 months, the food you bought just didn't last and you didn't have money to get more. : Never true   Tobacco Use: Low Risk  (11/21/2022)    Patient History     Smoking Tobacco Use: Never     Smokeless Tobacco Use: Never     Passive Exposure: Not on file   Housing/Utilities: Not on file   Alcohol Use: Not At Risk (07/15/2022)    Received from [facility], [facility]    AUDIT-C     Frequency of Alcohol Consumption: Never     Average Number of Drinks: Not on file     Frequency of Binge Drinking: Not on file   Transportation Needs: Not on file (10/11/2022)   Substance Use: Not on file   Health Literacy: Not on file   Physical Activity: Patient Declined (07/15/2022)    Received from [facility], [facility]    Exercise Vital Sign     Days of Exercise per Week: Patient declined     Minutes of Exercise per Session: Patient declined   Interpersonal Safety: Unknown (11/27/2022)    Interpersonal Safety     Unsafe Where You Currently Live: Not on file     Physically Hurt by Anyone: Not on file     Abused by Anyone: Not on file   Stress: Stress Concern Present (07/15/2022)    Received from [facility], [facility] of Occupational Health - Occupational Stress Questionnaire     Feeling of Stress : To some extent   Intimate Partner Violence: Not At Risk (07/15/2022)    Received from [facility], [facility]    HITS     Over the last 12 months how often  did your partner physically hurt you?: 1     Over the last 12 months how often did your partner insult you or talk down to you?: 1     Over the last 12 months how often did your partner threaten you with physical harm?: 1     Over the last 12 months how often did your partner scream or curse at you?: 1   Depression: Not at risk (06/18/2022)    Received from [facility], [facility]    Depression     PHQ-2 Total Score: 0   Social Connections: Socially Isolated (07/15/2022)    Received from [facility], [facility]    Social Network     How would you rate your social network (family, work, friends)?: Little participation, lonely and socially isolated       Would you be willing to receive help with any of the needs that you have identified today? Not applicable       Clydell Hakim, PharmD  [facility] Specialty Pharmacist

## 2022-11-27 NOTE — Unmapped (Signed)
[facility] Specialty Medication Onboarding    Specialty Medication: Humira  Prior Authorization: Approved   Financial Assistance: No - copay  <$25  Final Copay/Day Supply: $4 / 15 days (LD)          $4 / 28 days (MD)    Insurance Restrictions: None     Notes to Pharmacist:   Credit Card on File: no    The triage team has completed the benefits investigation and has determined that the patient is able to fill this medication at [facility]. Please contact the patient to complete the onboarding or follow up with the prescribing physician as needed.

## 2022-11-28 MED FILL — EMPTY CONTAINER: 120 days supply | Qty: 1 | Fill #0

## 2022-12-10 NOTE — Unmapped (Signed)
[facility] Clinical Assessment & Refill Coordination Note    Diana Brown, DOB: [birth year]  Phone: There are no phone numbers on file.    All above HIPAA information was verified with patient.     Was a Nurse, learning disability used for this call? No    Specialty Medication(s):   Inflammatory Disorders: Humira     Current Outpatient Medications   Medication Sig Dispense Refill    adalimumab (HUMIRA,CF, PEN) 80 mg/0.8 mL PnKt Inject the contents of 2 pens (160 mg) on day 1, THEN 1 pen (80 mg) on day 15. 3 each 0    adalimumab (HUMIRA,CF, PEN) 80 mg/0.8 mL PnKt Inject the contents of 1 pen (80 mg total) under the skin every fourteen (14) days. 2 each 11    cefdinir (OMNICEF) 300 MG capsule Take 1 capsule (300 mg total) by mouth two (2) times a day. (Patient not taking: Reported on 11/27/2022) 60 capsule 2    doxycycline (MONODOX) 100 MG capsule  (Patient not taking: Reported on 11/27/2022)      empty container (SHARPS-A-GATOR DISPOSAL SYSTEM) Misc Use as directed 1 each 2    metroNIDAZOLE (FLAGYL) 500 MG tablet Take 1 tablet (500 mg total) by mouth Three (3) times a day. (Patient not taking: Reported on 11/27/2022) 270 tablet 0    moxifloxacin (AVELOX) 400 mg tablet Take 1 tablet (400 mg total) by mouth daily. (Patient not taking: Reported on 11/27/2022) 90 tablet 0    mupirocin (BACTROBAN) 2 % ointment Apply topically up to three times a day to belly button for a week or until improved. 30 g 1    upadacitinib (RINVOQ) 15 mg Tb24 Take 15 mg by mouth daily.       No current facility-administered medications for this visit.        Changes to medications: [name] reports no changes at this time.    Allergies   Allergen Reactions    Sulfasalazine Rash, Hives and Itching       Changes to allergies: No    SPECIALTY MEDICATION ADHERENCE     Humira 80mg /0.77mL : 0 doses of medicine on hand     Medication Adherence    Patient reported X missed doses in the last month: 0  Specialty Medication: Humira 80mg /0.69mL  Informant: patient  Support network for adherence: family member  Confirmed plan for next specialty medication refill: delivery by pharmacy  Refills needed for supportive medications: not needed          Specialty medication(s) dose(s) confirmed: Regimen is correct and unchanged.     Are there any concerns with adherence? No    Adherence counseling provided? Not needed    CLINICAL MANAGEMENT AND INTERVENTION      Clinical Benefit Assessment:    Do you feel the medicine is effective or helping your condition? Yes    Clinical Benefit counseling provided? Not needed    Adverse Effects Assessment:    Are you experiencing any side effects? No    Are you experiencing difficulty administering your medicine? No    Quality of Life Assessment:    Quality of Life    Rheumatology  Oncology  Dermatology  1. What impact has your specialty medication had on the symptoms of your skin condition (i.e. itchiness, soreness, stinging)?: Some  2. What impact has your specialty medication had on your comfort level with your skin?: Some  Cystic Fibrosis          How many days  over the past month did your HS  keep you from your normal activities? For example, brushing your teeth or getting up in the morning. Patient declined to answer    Have you discussed this with your provider? Not needed    Acute Infection Status:    Acute infections noted within Epic:  No active infections  Patient reported infection: None    Therapy Appropriateness:    Is therapy appropriate based on current medication list, adverse reactions, adherence, clinical benefit and progress toward achieving therapeutic goals? Yes, therapy is appropriate and should be continued     DISEASE/MEDICATION-SPECIFIC INFORMATION      For patients on injectable medications: Patient currently has 0 doses left.  Next injection is scheduled for 12/13/2022.    Chronic Inflammatory Diseases: Have you experienced any flares in the last month? No  Has this been reported to your provider? Not applicable    PATIENT SPECIFIC NEEDS     Does the patient have any physical, cognitive, or cultural barriers? No    Is the patient high risk? No    Did the patient require a clinical intervention? No    Does the patient require physician intervention or other additional services (i.e., nutrition, smoking cessation, social work)? No    SOCIAL DETERMINANTS OF HEALTH     At the [facility], we have learned that life circumstances - like trouble affording food, housing, utilities, or transportation can affect the health of many of our patients.   That is why we wanted to ask: are you currently experiencing any life circumstances that are negatively impacting your health and/or quality of life? Patient declined to answer    Social Determinants of Health     Financial Resource Strain: Patient Declined (07/15/2022)    Received from [facility], [facility]    Overall Financial Resource Strain (CARDIA)     Difficulty of Paying Living Expenses: Patient declined   Internet Connectivity: Not on file   Food Insecurity: Low Risk  (10/11/2022)    Received from [facility]    Food vital sign     Within the past 12 months, you worried that your food would run out before you got money to buy more: Never true     Within the past 12 months, the food you bought just didn't last and you didn't have money to get more. : Never true   Tobacco Use: Low Risk  (11/21/2022)    Patient History     Smoking Tobacco Use: Never     Smokeless Tobacco Use: Never     Passive Exposure: Not on file   Housing/Utilities: Not on file   Alcohol Use: Not At Risk (07/15/2022)    Received from [facility], [facility]    AUDIT-C     Frequency of Alcohol Consumption: Never     Average Number of Drinks: Not on file     Frequency of Binge Drinking: Not on file   Transportation Needs: Not on file (10/11/2022)   Substance Use: Not on file   Health Literacy: Not on file   Physical Activity: Patient Declined (07/15/2022)    Received from [facility], [facility]    Exercise Vital Sign     Days of Exercise per Week: Patient declined     Minutes of Exercise per Session: Patient declined   Interpersonal Safety: Unknown (12/10/2022)    Interpersonal Safety     Unsafe Where You Currently Live: Not on file  Physically Hurt by Anyone: Not on file     Abused by Anyone: Not on file   Stress: Stress Concern Present (07/15/2022)    Received from [facility], [facility]    [facility] of Occupational Health - Occupational Stress Questionnaire     Feeling of Stress : To some extent   Intimate Partner Violence: Not At Risk (07/15/2022)    Received from [facility], [facility]    HITS     Over the last 12 months how often did your partner physically hurt you?: 1     Over the last 12 months how often did your partner insult you or talk down to you?: 1     Over the last 12 months how often did your partner threaten you with physical harm?: 1     Over the last 12 months how often did your partner scream or curse at you?: 1   Depression: Not at risk (06/18/2022)    Received from [facility], [facility]    Depression     PHQ-2 Total Score: 0   Social Connections: Socially Isolated (07/15/2022)    Received from [facility], [facility]    Social Network     How would you rate your social network (family, work, friends)?: Little participation, lonely and socially isolated       Would you be willing to receive help with any of the needs that you have identified today? Not applicable       SHIPPING     Specialty Medication(s) to be Shipped:   Inflammatory Disorders: Humira    Other medication(s) to be shipped: No additional medications requested for fill at this time     Changes to insurance: No    Delivery Scheduled: Yes, Expected medication delivery date: 12/12/2022.     Medication will be delivered via UPS to the confirmed prescription address in [facility].    The patient will receive a drug information handout for each medication shipped and additional FDA Medication Guides as required.  Verified that patient has previously received a Conservation officer, historic buildings and a Surveyor, mining.    The patient or caregiver noted above participated in the development of this care plan and knows that they can request review of or adjustments to the care plan at any time.      All of the patient's questions and concerns have been addressed.    Elnora Morrison, PharmD   [facility] Specialty Pharmacist

## 2022-12-11 MED FILL — HUMIRA(CF) PEN 80 MG/0.8 ML SUBCUTANEOUS KIT: SUBCUTANEOUS | 28 days supply | Qty: 2 | Fill #0

## 2022-12-11 NOTE — Progress Notes (Deleted)
Office Visit Note  Patient: Angie Stone             Date of Birth: 06-Sep-2000           MRN: [id number]             PCP: Lucila Maine Referring: Lucila Maine Visit Date: 12/25/2022   Subjective:  No chief complaint on file.   History of Present Illness: Angie Stone is a 22 y.o. 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 daily.    Previous HPI 09/25/2022 Angie Stone is a 22 y.o. 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 daily.  Since her last visit she has not experienced any severe flareups of rheumatoid arthritis but does intermittently experience swelling most often in the knees and feet and joint pain in these areas as well as hands and wrists.  Wrist pain has been worse use related as she has been pushing off more to assist with standing and getting up from low positions due to some groin pain from hidradenitis lesions.  She saw her new dermatologist earlier this month and was prescribed moxifloxacin and metronidazole for active disease so far cyst remain but some decrease in the amount of redness and pain.  She has not required any extra steroid treatment due to symptom flareups since our last visit.   07/02/22 Angie Stone is a 22 y.o. female here for follow up for RA with history of [name] and hidradeniitis suppurativa.   Since our last visit she stopped remicade and started with cosentyx injections du to worsening HS disease activity. Continued on rinvoq 15 mg PO daily. No problems with the injections and joints are overall doing okay. Right wrist pain and stiffness increased, lasting several hours each morning. Knees are doing somewhat better. Ankle pain is worse though, also with more pain on the  bottom of the foot especially first thing in the morning. Cannot tolerate walking barefoot on wood floors now.  Has difficulty with any activity requiring raising arms above head even has difficulty managing her own wound care and hygiene of hidradenitis lesions in the axillary regions.  Does have some difficulty with changing clothes due to her shoulders.  Skin disease is currently worst part of activity this has progressively worsened now with lesions scattered throughout from her neck down to the groin.  She has never had lesions involving her neck or into her arms more distant than the axillary region until the past few months.  She was restarted on doxycycline but so far not seem to be symptom improvement.  Seeing wound care for the extensive inflammation and multiple draining lesions. She was also recommended to see a specialist at [facility] dermatology.   Previous HPI 04/16/22 Angie Stone is a 22 y.o. female here for follow up for RA with history of [name] and hydradenitis suppurativa on remicade 1000 mg q6wks and rinvoq 15 mg daily. She saw her dermatologist Dr. Gwen Pounds recently and with ongoing HS inflammation recently concerning for decreased effectiveness of her treatment regimen. The wounds are partially improved today, several open lesions in axillae but no longer raised or draining fluid. Joint pain is slightly worse in the knees she associated with colder weather as there is no increase in swelling.  Previous HPI 03/14/22 Angie Stone is a 22 y.o. female here for follow up for rheumatoid arthritis with history of [name] on remicade infusion 1000 mg q6wks and rinvoq 15 mg daily.  Since her last visit she is doing overall okay but has experienced some increase in left knee pain with small amount of joint swelling.  She is also having ongoing hidradenitis symptoms.  She completed most recently a course of Keflex for associated abscess with improvement.  Currently still has some drainage coming from  the lesions under her breast.  Had recent follow-up with dermatology clinic Dr. Gwen Pounds currently on isotretinoin 40 mg daily recommends several more months treatment.   Previous HPI 12/11/21 Angie Stone is a 22 y.o. female here for follow up for inflammatory arthritis with history of [name] on remicade infusion 1000 mg q6wks and rinvoq 15 mg daily.  She has not had any significant flareup of arthritis symptoms since her last visit.  She still has knee pain most commonly at nighttime not associated with significant swelling.  She continues to have a small area of skin numbness over decree sensation on the back of the right leg above the level of the knee.  She did have worsening hidradenitis lesion in the left axilla that required drainage and had delayed wound healing for about a month.  Before that have been about a year since requiring any aspiration or drainage for a chest.  Otherwise she remains on the low-dose doxycycline treatment.   Previous HPI 07/31/2021 Angie Stone is a 22 y.o. female here for follow up for inflammatory arthritis with history of [name] on remicade infusion 1000 mg q6wks and rinvoq 15 mg daily. Joint symptoms are mostly doing well she had some days with increased knee pain and stiffness, no visible swelling, warmth, or redness. She notices joint pain is worse in the last 5th or 6th week between remicade infusion. Right axilla with few open sores draining other areas under arms and breasts about the same. She saw dermatology yesterday with recommendation of oral doxycycline and topical cleansing agent for hidradenitis symptoms.   Previous HPI 03/20/21 Angie Stone is a 22 y.o. 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  Symptoms remain well controlled no joint pain swelling or skin disease activity.   Previous HPI 03/06/20 Angie Stone is a 22 y.o. female here for evaluation of  erosive, seronegative, polyarticular [name] currently on treatment with tofacitinib and infliximab. She is looking to transfer care to [facility] from [facility] [place] due to proximity and transitioning to adult rheumatology clinic. Currently she feels symptoms are very well controlled but she restarting the infliximab infusions round March due to symptoms increased on tofacitinib monotherapy.    She denies eye inflammation or visual acuity change. She does have recurrent skin lesions she describes as boils over the same area on her upper torso or axillary region. She denies cough, dyspnea, lymphadenopathy, oral ulcers, diarrhea, hematochezia, or alopecia.   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 She has no history of uveitis. She underwent bilateral hip arthroplasty in 2015.   Previous treatments: Tocilizumab, methotrexate ~ 2014 Orencia, Kineret, methotrexate 2014-2015, 2016 Remicade, methotrexate  2016-2020 Sulfasalazine 2018-2019 not tolerated due to rash Leflunomide 2019 ineffective Tofacitinib 2019-current Infliximab resumed 05/2019 due to disease flare   No Rheumatology ROS completed.   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    Past Medical History:  Diagnosis Date   Immunosuppression (HCC)    [name] (juvenile idiopathic arthritis) (HCC)     Family History  Problem Relation Age of Onset   Diabetes Mother    Diabetes Maternal Grandmother    Arthritis Paternal Grandmother    Past Surgical History:   Procedure Laterality Date   JOINT REPLACEMENT Bilateral    Hips   Social History   Social History Narrative   Not on file   Immunization History  Administered Date(s) Administered   DTaP 01/18/2002, 04/22/2002, 06/03/2002, 08/01/2004, 11/08/2005   HIB (PRP-OMP) 08/01/2004   HIB, Unspecified 01/18/2002, 04/22/2002, 06/03/2002, 08/01/2004   Hep A, Unspecified 06/20/2005   Hep B, Unspecified 05/09/01, 01/18/2002, 11/04/2002   Hepatitis A, Ped/Adol-2 Dose 06/20/2005, 11/25/2007   Hepatitis B, PED/ADOLESCENT 11/17/01, 01/18/2002, 11/04/2002   IPV 01/18/2002, 04/22/2002, 11/04/2002, 11/08/2005   Influenza Nasal 01/09/2009   Influenza, Seasonal, Injecte, Preservative Fre 01/06/2015, 01/03/2016   Influenza,inj,Quad PF,6+ Mos 01/06/2015, 01/03/2016, 01/15/2017, 01/15/2017, 12/24/2018, 12/24/2018   Influenza-Unspecified 01/06/2015, 01/03/2016, 01/07/2018   MMR 11/04/2002, 11/08/2005   MMRV 11/08/2005   Meningococcal Acwy, Unspecified 11/27/2012   PFIZER(Purple Top)SARS-COV-2 Vaccination 02/18/2020, 03/22/2020   Pneumococcal Conjugate PCV 7 01/18/2002, 04/22/2002, 06/03/2002, 08/01/2004   Pneumococcal Conjugate-13 01/18/2002, 04/22/2002, 06/03/2002, 08/01/2004   Tdap 12/07/2012   Varicella 11/04/2002, 11/08/2005     Objective: Vital Signs: There were no vitals taken for this visit.   Physical Exam   Musculoskeletal Exam: ***  CDAI Exam: CDAI Score: -- Patient Global: --; Provider Global: -- Swollen: --; Tender: -- Joint Exam 12/25/2022   No joint exam has been documented for this visit   There is currently no information documented on the homunculus. Go to the Rheumatology activity and complete the homunculus joint exam.  Investigation: No additional findings.  Imaging: No results found.  Recent Labs: Lab Results  Component Value Date   WBC 7.1 09/25/2022   HGB 12.3 09/25/2022   PLT 314 09/25/2022   NA 138 09/25/2022   K 4.1 09/25/2022   CL 104 09/25/2022    CO2 24 09/25/2022   GLUCOSE 75 09/25/2022   BUN 7 09/25/2022   CREATININE 0.72 09/25/2022   BILITOT 0.4 09/25/2022   ALKPHOS 69 04/11/2022   AST 16 09/25/2022   ALT 19 09/25/2022   PROT 8.6 (H) 09/25/2022   ALBUMIN 3.4 (L) 04/11/2022   CALCIUM 9.7 09/25/2022   GFRAA 144 09/13/2020   QFTBGOLDPLUS Negative 01/08/2022    Speciality Comments: Diagnosed in 2010. Combination of tocilizumab IV q2wk and MTX-initiation unclear-d/c 09/2012 Combination Orencia IV q4wk, Kineret sq daily, MTX-7/14-4/15 Tried injectable MTX 30 mg weekly-d/c SE and inefficacy  Remicade infusions 10 mg/kg every 4 weeks Tried SSZ in 2018 and again 2019-d/c both times due to rash Arava-05/16/17-01/06/18-Inadequate response Xeljanz started 01/08/18 Remicade stopped 04/11/22 Cosentyx started 05/23/22  Procedures:  No procedures performed Allergies: Sulfasalazine   Assessment / Plan:     Visit Diagnoses: No diagnosis found.  ***  Orders: No orders of the defined types were placed in this encounter.  No orders of the defined types  were placed in this encounter.    Follow-Up Instructions: No follow-ups on file.   Metta Clines, RT  Note - This record has been created using [employer].  Chart creation errors have been sought, but may not always  have been located. Such creation errors do not reflect on  the standard of medical care.

## 2022-12-25 ENCOUNTER — Ambulatory Visit: Payer: Medicaid Other | Admitting: Internal Medicine

## 2022-12-25 DIAGNOSIS — L732 Hidradenitis suppurativa: Secondary | ICD-10-CM

## 2022-12-25 DIAGNOSIS — Z79899 Other long term (current) drug therapy: Secondary | ICD-10-CM

## 2022-12-25 DIAGNOSIS — M0600 Rheumatoid arthritis without rheumatoid factor, unspecified site: Secondary | ICD-10-CM

## 2023-01-02 NOTE — Unmapped (Signed)
[facility] Refill Coordination Note    Specialty Medication(s) to be Shipped:   Inflammatory Disorders: Humira    Other medication(s) to be shipped: No additional medications requested for fill at this time     Diana Brown, DOB: Sep 30, 2000  Phone: There are no phone numbers on file.      All above HIPAA information was verified with patient.     Was a Nurse, learning disability used for this call? No    Completed refill call assessment today to schedule patient's medication shipment from the [facility]  [phone]).  All relevant notes have been reviewed.     Specialty medication(s) and dose(s) confirmed: Regimen is correct and unchanged.   Changes to medications: [name] reports starting the following medications: motrin 600mg   Changes to insurance: No  New side effects reported not previously addressed with a pharmacist or physician: None reported  Questions for the pharmacist: No    Confirmed patient received a Conservation officer, historic buildings and a Surveyor, mining with first shipment. The patient will receive a drug information handout for each medication shipped and additional FDA Medication Guides as required.       DISEASE/MEDICATION-SPECIFIC INFORMATION        For patients on injectable medications: Patient currently has 0 doses left.  Next injection is scheduled for 10/11.    SPECIALTY MEDICATION ADHERENCE     Medication Adherence    Patient reported X missed doses in the last month: 0  Specialty Medication: adalimumab (HUMIRA,CF, PEN) 80 mg/0.8 mL PnKt  Patient is on additional specialty medications: No  Patient is on more than two specialty medications: No  Any gaps in refill history greater than 2 weeks in the last 3 months: no  Demonstrates understanding of importance of adherence: yes  Informant: patient  Reliability of informant: reliable  Provider-estimated medication adherence level: good  Patient is at risk for Non-Adherence: No  Reasons for non-adherence: no problems identified  Support network for adherence: family member  Confirmed plan for next specialty medication refill: delivery by pharmacy  Refills needed for supportive medications: not needed          Refill Coordination    Has the Patients' Contact Information Changed: No  Is the Shipping Address Different: No         Were doses missed due to medication being on hold? No    humira 80/0.8 mg/ml: 0 days of medicine on hand       REFERRAL TO PHARMACIST     Referral to the pharmacist: Not needed      [facility]     Shipping address confirmed in Epic.       Delivery Scheduled: Yes, Expected medication delivery date: 10/09.     Medication will be delivered via UPS to the prescription address in Epic [place].    [facility]  Specialty Technician

## 2023-01-07 MED FILL — HUMIRA(CF) PEN 80 MG/0.8 ML SUBCUTANEOUS KIT: SUBCUTANEOUS | 28 days supply | Qty: 2 | Fill #1

## 2023-01-20 ENCOUNTER — Other Ambulatory Visit (HOSPITAL_BASED_OUTPATIENT_CLINIC_OR_DEPARTMENT_OTHER): Payer: Self-pay

## 2023-01-20 MED ORDER — WEGOVY 0.25 MG/0.5ML ~~LOC~~ SOAJ
0.2500 mg | SUBCUTANEOUS | 0 refills | Status: DC
  Filled 2023-01-20: qty 2, 28d supply, fill #0

## 2023-01-21 ENCOUNTER — Other Ambulatory Visit (HOSPITAL_BASED_OUTPATIENT_CLINIC_OR_DEPARTMENT_OTHER): Payer: Self-pay

## 2023-01-28 ENCOUNTER — Other Ambulatory Visit (HOSPITAL_BASED_OUTPATIENT_CLINIC_OR_DEPARTMENT_OTHER): Payer: Self-pay

## 2023-01-29 NOTE — Unmapped (Signed)
[facility] Refill Coordination Note    Specialty Medication(s) to be Shipped:   Inflammatory Disorders: Humira    Other medication(s) to be shipped: No additional medications requested for fill at this time     Diana Brown, DOB: 2001-02-07  Phone: There are no phone numbers on file.      All above HIPAA information was verified with patient.     Was a Nurse, learning disability used for this call? No    Completed refill call assessment today to schedule patient's medication shipment from the [facility]  [phone]).  All relevant notes have been reviewed.     Specialty medication(s) and dose(s) confirmed: Regimen is correct and unchanged.   Changes to medications: Diana Brown reports no changes at this time.  Changes to insurance: No  New side effects reported not previously addressed with a pharmacist or physician: None reported  Questions for the pharmacist: No    Confirmed patient received a Conservation officer, historic buildings and a Surveyor, mining with first shipment. The patient will receive a drug information handout for each medication shipped and additional FDA Medication Guides as required.       DISEASE/MEDICATION-SPECIFIC INFORMATION        For patients on injectable medications: Patient currently has 0 doses left.  Next injection is scheduled for 02/07/23.    SPECIALTY MEDICATION ADHERENCE     Medication Adherence    Patient reported X missed doses in the last month: 0  Specialty Medication: Humira 80 mg/0.8 mL  Patient is on additional specialty medications: No  Informant: patient  Support network for adherence: family member     Were doses missed due to medication being on hold? No    humira 80/0.8 mg/ml: 0 days of medicine on hand       REFERRAL TO PHARMACIST     Referral to the pharmacist: Not needed      [facility]     Shipping address confirmed in Epic.     Delivery Scheduled: Yes, Expected medication delivery date: 02/04/23.     Medication will be delivered via UPS to the prescription address in Epic [place].    Diana Brown M Elisabeth Cara   [facility]  Specialty Technician

## 2023-02-03 MED FILL — HUMIRA(CF) PEN 80 MG/0.8 ML SUBCUTANEOUS KIT: SUBCUTANEOUS | 28 days supply | Qty: 2 | Fill #2

## 2023-02-07 DIAGNOSIS — L732 Hidradenitis suppurativa: Principal | ICD-10-CM

## 2023-02-07 MED ORDER — CLINDAMYCIN PHOSPHATE 1 % TOPICAL SWAB
Freq: Two times a day (BID) | TOPICAL | 6 refills | 0.00000 days | Status: CP
Start: 2023-02-07 — End: ?

## 2023-02-17 ENCOUNTER — Other Ambulatory Visit (HOSPITAL_BASED_OUTPATIENT_CLINIC_OR_DEPARTMENT_OTHER): Payer: Self-pay

## 2023-02-17 MED ORDER — WEGOVY 0.25 MG/0.5ML ~~LOC~~ SOAJ
0.2500 mg | SUBCUTANEOUS | 0 refills | Status: DC
  Filled 2023-02-17: qty 2, 28d supply, fill #0

## 2023-02-17 NOTE — Unmapped (Signed)
Dermatology Note     Assessment and Plan:      Hidradenitis Suppurativa, Hurley III in patient with history of seronegative RA and [name] - flaring off of Rinvoq   - Prior treatments: Remicade infusions, doxycycline > 90 days, isotretinoin, Cosentyx (discontinued 09/2022), prednisone, methotrexate  - No longer on Rinvoq per patient (not effective). Seeing Rheum (Dr. Ancil Linsey) 03/2023  - Increase humira 80 mg injections to weekly for maintenance.   - Start amoxicillin-clavulanate (AUGMENTIN) 875-125 mg per tablet; Take 1 tablet by mouth two (2) times a day. With food for 7-10 days as needed for HS flares.  - Given concurrent arthritis flare, Start prednisone (DELTASONE) 10 MG tablet; Take 4 tablets (40 mg total) by mouth daily for 7 days, THEN 3 tablets (30 mg total) daily for 7 days, THEN 2 tablets (20 mg total) daily for 7 days, THEN 1 tablet (10 mg total) daily for 7 days.  - Recommended patient discuss restarting methotrexate with rheum as this may help HS as well. She previously stopped due to GI upset. Consider leucovorin to aid with side effects.       High Risk Medication Use (Humira)  - CBC w/ diff, AST, ALT, BUN, Cr, and Lipid Panel obtained on 11/21/2022 - WNL  - Quantiferon Gold and Hep B/C panel obtained on 11/21/2022 - Negative    The patient was advised to call for an appointment should any new, changing, or symptomatic lesions develop.     RTC: Return in about 4 months (around 06/20/2023). or sooner as needed   _________________________________________________________________      Chief Complaint     Chief Complaint   Patient presents with    Hidradenitis suppurativa     Pt coming in for HS follow up.   Current flares in groin and inner thighs, under both breast, underarms.        HPI     Diana Brown is a 22 y.o. female who presents as a returning patient (last seen by Lowry Bowl, PA on 11/21/2022) to Dermatology for follow up of HS. At last visit, patient was to continue rinvoq 15 mg daily and start humira 160 mg injection and then 80 mg injection every 2 weeks.    HS  - Reports her HS is going about the same  - States that she has a little bit less pain and that the flares do not last as long anymore  - Endorses she has been on Humira for about 2 months and states that   - Notes that her rheumatologist stopped the rinvoq but her arthritis has been flaring since.  - Describes that she is flaring in the underarms, breasts, and groin    The patient denies any other new or changing lesions or areas of concern.     Pertinent Past Medical History     Past Medical History, Family History, Social History, Medication List, Allergies, and Problem List were reviewed in the rooming section of Epic.     ROS: Other than symptoms mentioned in the HPI, no fevers, chills, or other skin complaints    Physical Examination     GENERAL: Well-appearing female in no acute distress, resting comfortably.  NEURO: Alert and oriented, answers questions appropriately  PSYCH: Normal mood and affect  SKIN: Examination of the face, chest, abdomen, back, bilateral upper extremities, bilateral lower extremities, and groin was performed  - Multiple draining sinuses and inflamed nodules of the bilateral axillae  - Nondraining sinuses and inflamed nodules of  the groin, infrapannus    All areas not commented on are within normal limits or unremarkable    Scribe's Attestation: Lowry Bowl, PA-C obtained and performed the history, physical exam and medical decision making elements that were entered into the chart.  Signed by Cherlynn Kaiser, Scribe, on February 20, 2023 at 3:51 PM.    ----------------------------------------------------------------------------------------------------------------------  February 21, 2023 8:07 AM. Documentation assistance provided by the Scribe. I was present during the time the encounter was recorded. The information recorded by the Scribe was done at my direction and has been reviewed and validated by me.  ----------------------------------------------------------------------------------------------------------------------      (Approved Template 12/13/2019)

## 2023-02-20 ENCOUNTER — Ambulatory Visit
Admit: 2023-02-20 | Discharge: 2023-02-21 | Payer: PRIVATE HEALTH INSURANCE | Attending: Student in an Organized Health Care Education/Training Program | Primary: Student in an Organized Health Care Education/Training Program

## 2023-02-20 DIAGNOSIS — L732 Hidradenitis suppurativa: Principal | ICD-10-CM

## 2023-02-20 MED ORDER — HUMIRA(CF) PEN 80 MG/0.8 ML SUBCUTANEOUS KIT
SUBCUTANEOUS | 11 refills | 14.00000 days | Status: CP
Start: 2023-02-20 — End: ?
  Filled 2023-02-25: qty 4, 28d supply, fill #0

## 2023-02-20 MED ORDER — AMOXICILLIN 875 MG-POTASSIUM CLAVULANATE 125 MG TABLET
ORAL_TABLET | Freq: Two times a day (BID) | ORAL | 2 refills | 30.00000 days | Status: CP
Start: 2023-02-20 — End: ?

## 2023-02-20 MED ORDER — PREDNISONE 10 MG TABLET
ORAL_TABLET | ORAL | 0 refills | 28.00000 days | Status: CP
Start: 2023-02-20 — End: 2023-03-20

## 2023-02-20 NOTE — Unmapped (Addendum)
For your HS:  - Continue humira injections every 14 days and once approved, increase to weekly  - Take 1 tablet (875-125 mg) of augmentin twice daily for 7-10 days when flaring.  - Prednisone Taper:   - Take 4 tablets (40 mg) once daily for 7 days  - Take 3 tablets (30 mg) once daily for 7 days  - Take 2 tablets (20 mg) once daily for 7 days  - Take 1 tablet (10 mg) once daily for 7 days    - Talk to your rheumatologist about:  - Restarting methotrexate with leucovorin    _________________________________________________    Meet your team:     Your intake nurse is: Verne Carrow     Please remember to fill out the survey you will receive after your visit. Your comments help [place] continue to improve our care.      Thanks in advance!      [facility] Dermatology Clinical Staff     _________________________________________________    Bonita Quin are welcome to join the [facility] for HS of the [employer] Support group online at [email or web address] or in-person at our regular meetings.  You can textHS to [phone] for meeting reminders or join the group online for regular updates.  This can be a great opportunity to interact and learn from other patients and help work with the HS community.  We hope to see your there!    Hidradentis Suppurativa (pronounced ???high-drad-en-eye-tis/sup-your-uh-tee-vah???) is a chronic disease of hair follicles.  The lesions occur most commonly on areas of skin-to-skin contact: under the arms (axillary area), in the groin, around the buttocks, in the region around the anus and genitals, and on the skin between and under the breasts. In women, the underarms, groin, and breast areas are most commonly affected. Men most often have HS lesions on the buttocks and under the arms and may also have HS at the back of the neck and behind and around the ears.    What does HS look and feel like?   The first thing that someone with HS notices is a tender, raised, red bump that looks like an under-the-skin pimple or boil. Sometimes HS lesions have two or more ???heads.???  In mild disease only an occasional boil or abscess may occur, but in more active disease there can be many new lesions every month.  Some abscesses can become larger and may open and drain pus.  Bleeding and increased odor can also occur. In severe disease, deeper abscesses develop and may connect with each other under the skin to form tunnel-like tracts (sinuses, fistulas).  These may drain constantly, or may temporarily improve and then usually begin draining again over time.  In people who have had sinus tracts for some time, scars form that feel like ropes under the skin. In the very worst cases, networks of sinus tracts can form deeper in the body, including the muscle and other tissues. Many people with severe HS have scars that can limit their ability to freely move their arms or legs, though this is very unlikely for most patients.     Clinicians usually classify or ???grade??? HS using the [facility] staging system according to the severity of the disease for each body location:   [place] stage I: one or more abscesses are present, but no sinus tracts have formed and no scars have developed   [place] Adas stage II: one or more abscesses are present that resolve and recur; on sinus tract can be present and scarring  is seen   Doreene Adas stage III: many abscesses and more than one sinus tract is present with extensive scars.    What causes HS?  The cause of HS is not completely understood.  It seems to be a disorder of hair follicles and often many family members are affected so genetics probably play a strong role.  Bacteria are often present and may make the disease worse, but infection does not seem to be the main cause. Hormones are also likely play a role since the condition typically starts around puberty when hair follicles under the arms and in the groin start to change.  It can sometimes flare with menstrual cycles in women as well.  In most cases it lasts for decades and starts to improve to some extent in the late 30s and 40s as long as many fistulas have not already formed.  Women are three times more likely than men to develop HS.    Other factors are known to contribute to HS flaring or becoming worse, though they are likely not the main causes. The factors most commonly associated with HS include:   Cigarette smoking - Stopping smoking will likely not cure the disease, but likely is helpful in reducing how much and how often it flares and may prevent it from getting as bad over time.   Higher weight - HS may occur even in people that are not overweight, but it is much more common in patients that are.  There is some evidence that losing weight and eating a diet low in sugars and fats may be helpful in improving hidradenitis, though this is not helpful for everyone.  Working with a nutritionist may be an important way to help with this and is something your physician can help coordinate    Hidradenitis is not contagious.  It is not caused by a problem with personal hygiene or any other activity or behavior of those with the disease.    How can your doctor help you treat your hidradenitis?  Clinicians use both medication and surgery to treat HS. The choice of treatment--or combination of treatments--is made according to an individual patient???s needs. Clinicians consider several factors in determining the most appropriate plan for therapy:   Severity of disease - medications and some laser treatments are usually able to control disease best when fistulas are not present.  Fistulas typically require surgery.   Extent and location of disease   Chronicity (how often the lesions recur)    A number of different surgical methods have been developed that are useful for certain patients under particular circumstances. These can be done with local numbing and healing at home for some areas when disease is not too extensive with relatively brief recovery times.  In more extensive disease there may be a need for larger excisions under general anesthesia with healing time in the hospital and prolonged recovery periods for better disease control.      In addition, many medical treatments have been tried--some with more success than others. No medication is effective for all patients, and you and your doctor may have to try several different treatments or combinations of treatments before you find the treatment plan that works best for you.  The goals of therapy with medications that are either topical (used on the skin) or systemic (taken by mouth) are:  1. to clear the lesions or at least reduce their number and extent, and  2. to prevent new lesions from forming.  3. To  reduce pain, drainage, and odor  Some of the types of medications commonly used are antibacterial skin washes and the topical antibiotics to prevent secondary infections and corticosteroid injections into the lesions to reduce inflammation.     Other medications that may be used include retinoids (similar to Accutane), drugs that effect how hormones and hair follicles interact, drugs that affect your immune system (such as methotrexate, adalimumab/Humira, and Remicaid/infliximab), steroids, and oral antibiotics.    Lasers that destroy hair follicles can also be helpful since they reduce the hair follicles that cause the problems.  Multiple treatments are typically required over time and there is some discomfort associated with treatment, but it is typically very fast and well-tolerated.    It is very important to realize that hidradenitis cannot usually be completely cured with any single medication or surgical procedure.  It is a disease that can be very stubborn and difficult to control, but with good treatment a lot of improvement and sometimes temporary remissions can be obtained. Poorly controlled disease can cause more fistulas to form and make managing the disease much more difficult over time so it is important to seek care to reduce major flares.  Surgery can provide a long term cure in some areas, though the disease can start again or continue in nearby areas.  A dermatologist is often the best person to help coordinate disease treatment, and sometimes other surgeons, pain specialists, other specialists, and nutritionists may be part of the treatment team.    For severe disease, the first goal is often to reduce pain and symptoms with medicines so that the disease feels more stable. Once it's stable, we often start thinking about how to address areas that have completely gotten better with surgery if they are still causing problems.    What can you do to help your HS?  1. Stopping smoking is hard and may not fix everything, but it may be a step in the right direction.  We or your primary care physician can provide resources to help stop if you are interested.  2. Follow a healthy diet and try to achieve a healthy weight.  Some other self-help measures are:   Keep your skin cool and dry (becoming overheated and sweating can contribute to an HS flare)   To reduce the pain of cysts or nodules or to help them to drain, apply hot compresses or soak in hot water for 10 minutes at a time (use a clean washcloth or a teabag soaked in hot water)   For female patients, cotton underwear that does not have tight elastic in the groin can be helpful.  Boyshort, brief, or boxer style underwear may be a better option as friction on hair follicles in affected areas can be a major trigger in some patients.  These can be easily found on [place] or with some retailers.  Fruit of the Loom and Underworks are two brands that are sometimes recommended.    Finally, know that you are not alone. Coping with the pain and other symptoms of HS can be very difficult, so it may be helpful to connect with others who live with HS. Patient groups and networks can be sources of important information and support. Some internet resources for information and connections are provided below.    Psychologytoday.com is a resource to find psychologists and therapists that can help support you in your are     [email or web address] can help connect with sexual health resources and counselors  Resources for Information    The Hidradenitis Suppurativa Foundation: A nonprofit organized by a group of physicians interested in treating and advancing research in hidradenitis suppurativa.  This group advocates for better care and research for hidradenitis and has educational materials put together specifically for patients that have been reviewed and produced by doctors and people with hidradenitis.    American Academy of Dermatology  ARanked.fi    [employer]  ElevatorPitchers.de.html  [place]: IT trainer for Rare [employer]  https://www.rarediseases.org/rare-disease-information/rare-diseases/byID/358/viewAbstract  Trials of new medications for HS  Https://www.clinicaltrials.gov

## 2023-02-21 NOTE — Unmapped (Signed)
Clinical Assessment Needed For: Dose Change  Medication: HUMIRA(CF) PEN 80 mg/0.8 mL Pnkt (adalimumab)  Last Fill Date/Day Supply: 02/03/23 / 28 days  Copay $4  Was previous dose already scheduled to fill: No    Notes to Pharmacist:

## 2023-02-24 NOTE — Unmapped (Signed)
I reviewed with Kayleanna that the higher 80 mg/week had been approved.     [facility] Refill Coordination Note    Specialty Medication(s) to be Shipped:   Inflammatory Disorders: Humira    Other medication(s) to be shipped: No additional medications requested for fill at this time     Diana Brown, DOB: 05/07/2000  Phone: There are no phone numbers on file.      All above HIPAA information was verified with patient.     Was a Nurse, learning disability used for this call? No    Completed refill call assessment today to schedule patient's medication shipment from the [facility] and [facility] Delivery [facility]  [phone]).  All relevant notes have been reviewed.     Specialty medication(s) and dose(s) confirmed:  Dose change with this fill    Changes to medications: Shaneqwa reports no changes at this time.  Changes to insurance: No  New side effects reported not previously addressed with a pharmacist or physician: None reported  Questions for the pharmacist: No    Confirmed patient received a Conservation officer, historic buildings and a Surveyor, mining with first shipment. The patient will receive a drug information handout for each medication shipped and additional FDA Medication Guides as required.       DISEASE/MEDICATION-SPECIFIC INFORMATION        For patients on injectable medications: Patient currently has 0 doses left.  Next injection is scheduled for Friday, 11/29 (can go ahead and begin weekly dosing this week).    SPECIALTY MEDICATION ADHERENCE     Medication Adherence    Patient reported X missed doses in the last month: 0  Specialty Medication: Humira  Support network for adherence: family member              Were doses missed due to medication being on hold? No    Humira - 0 left    REFERRAL TO PHARMACIST     Referral to the pharmacist: Not needed      [facility]     Shipping address confirmed in Epic.       Delivery Scheduled: Yes, Expected medication delivery date: 11/27.     Medication will be delivered via UPS to the prescription address in Epic WAM.    Kahleel Fadeley [facility]  Specialty Pharmacist

## 2023-03-05 DIAGNOSIS — L732 Hidradenitis suppurativa: Principal | ICD-10-CM

## 2023-03-05 MED ORDER — MUPIROCIN 2 % TOPICAL OINTMENT
1 refills | 0 days
Start: 2023-03-05 — End: ?

## 2023-03-06 DIAGNOSIS — L732 Hidradenitis suppurativa: Principal | ICD-10-CM

## 2023-03-06 MED ORDER — TRIAMCINOLONE ACETONIDE 0.1 % TOPICAL OINTMENT
0 refills | 0 days | Status: CP
Start: 2023-03-06 — End: ?

## 2023-03-06 MED ORDER — MUPIROCIN 2 % TOPICAL OINTMENT
1 refills | 0 days | Status: CP
Start: 2023-03-06 — End: ?

## 2023-03-06 NOTE — Unmapped (Signed)
LOV: 02/20/2023    Requested Prescriptions     Pending Prescriptions Disp Refills    mupirocin (BACTROBAN) 2 % ointment 30 g 1     Sig: Apply topically up to three times a day to belly button for a week or until improved.     Recent Visits  Date Type Provider Dept   02/20/23 Office Visit Reva Bores, [place] [facility]   11/21/22 Office Visit Reva Bores, [place] [facility]   09/03/22 Office Visit Reva Bores, [place] [facility]   Showing recent visits within past 540 days with a meds authorizing provider and meeting all other requirements  Future Appointments  No visits were found meeting these conditions.  Showing future appointments within next 150 days with a meds authorizing provider and meeting all other requirements

## 2023-03-15 DIAGNOSIS — L732 Hidradenitis suppurativa: Principal | ICD-10-CM

## 2023-03-15 MED ORDER — MUPIROCIN 2 % TOPICAL OINTMENT
1 refills | 0.00 days
Start: 2023-03-15 — End: ?

## 2023-03-17 ENCOUNTER — Other Ambulatory Visit (HOSPITAL_BASED_OUTPATIENT_CLINIC_OR_DEPARTMENT_OTHER): Payer: Self-pay

## 2023-03-17 DIAGNOSIS — L732 Hidradenitis suppurativa: Principal | ICD-10-CM

## 2023-03-17 MED ORDER — MUPIROCIN 2 % TOPICAL OINTMENT
1 refills | 0.00 days | Status: CP
Start: 2023-03-17 — End: ?

## 2023-03-17 MED ORDER — WEGOVY 0.5 MG/0.5ML ~~LOC~~ SOAJ
0.5000 mg | SUBCUTANEOUS | 0 refills | Status: DC
  Filled 2023-03-17: qty 2, 28d supply, fill #0

## 2023-03-20 ENCOUNTER — Other Ambulatory Visit (HOSPITAL_BASED_OUTPATIENT_CLINIC_OR_DEPARTMENT_OTHER): Payer: Self-pay

## 2023-03-22 NOTE — Unmapped (Signed)
[facility] Delivery Pharm[facility] Refill Coordination Note    Diana Brown, DOB: [birth year]  Phone: There are no phone numbers on file.      All above HIPAA information was verified with patient.         03/21/2023     3:34 PM   Specialty Rx Medication Refill Questionnaire   Which Medications would you like refilled and shipped? Humira injection pin   Please list all current allergies: Sulfa   Have you missed any doses in the last 30 days? No   Have you had any changes to your medication(s) since your last refill? No   How many days remaining of each medication do you have at home? 0   If receiving an injectable medication, next injection date is 01/19/02   Have you experienced any side effects in the last 30 days? No   Please enter the full address (street address, city, state, zip code) where you would like your medication(s) to be delivered to. [address], [place], [place], [address]   Please specify on which day you would like your medication(s) to arrive. Note: if you need your medication(s) within 3 days, please call the pharmacy to schedule your order at [phone]  03/27/2023   Has your insurance changed since your last refill? No   Would you like a pharmacist to call you to discuss your medication(s)? No   Do you require a signature for your package? (Note: if we are billing Medicare Part B or your order contains a controlled substance, we will require a signature) No         Completed refill call assessment today to schedule patient's medication shipment from the [facility] Delivery Pharm[facility] [phone]).  All relevant notes have been reviewed.       Confirmed patient received a Conservation officer, historic buildings and a Surveyor, mining with first shipment. The patient will receive a drug information handout for each medication shipped and additional FDA Medication Guides as required.         REFERRAL TO PHARMACIST     Referral to the pharmacist: Not needed      [facility]     Shipping address confirmed in Epic.     Delivery Scheduled: Yes, Expected medication delivery date: 12/26.     Medication will be delivered via UPS to the prescription address in Epic WAM.    Diana Brown   [facility] Delivery Pharm[facility] Specialty Pharmacist

## 2023-03-24 DIAGNOSIS — L732 Hidradenitis suppurativa: Principal | ICD-10-CM

## 2023-03-24 MED ORDER — MUPIROCIN 2 % TOPICAL OINTMENT
1 refills | 0.00 days
Start: 2023-03-24 — End: ?

## 2023-03-25 DIAGNOSIS — L732 Hidradenitis suppurativa: Principal | ICD-10-CM

## 2023-03-25 MED ORDER — RIFAMPIN 300 MG CAPSULE
ORAL_CAPSULE | Freq: Two times a day (BID) | ORAL | 0 refills | 90 days | Status: CP
Start: 2023-03-25 — End: ?

## 2023-03-25 MED ORDER — CLINDAMYCIN HCL 300 MG CAPSULE
ORAL_CAPSULE | Freq: Two times a day (BID) | ORAL | 0 refills | 90.00 days | Status: CP
Start: 2023-03-25 — End: ?

## 2023-03-27 MED FILL — HUMIRA(CF) PEN 80 MG/0.8 ML SUBCUTANEOUS KIT: SUBCUTANEOUS | 28 days supply | Qty: 4 | Fill #1

## 2023-05-26 NOTE — Unmapped (Signed)
 The [facility] has made a second and final attempt to reach this patient to refill the following medication:Humira.      We have been unable to leave messages on the following phone numbers: [phone], have sent a text message to the following phone numbers: [phone], and have sent a Mychart questionnaire..    Dates contacted: 04/27/2023  05/26/2023  Last scheduled delivery: 03/28/2023    The patient may be at risk of non-compliance with this medication. The patient should call the [facility] at [phone]  Option 4, then Option 2: Dermatology, Gastroenterology, Rheumatology to refill medication.    Diana Brown

## 2023-06-05 ENCOUNTER — Ambulatory Visit
Admit: 2023-06-05 | Discharge: 2023-06-06 | Payer: PRIVATE HEALTH INSURANCE | Attending: Student in an Organized Health Care Education/Training Program | Primary: Student in an Organized Health Care Education/Training Program

## 2023-06-05 DIAGNOSIS — L732 Hidradenitis suppurativa: Principal | ICD-10-CM

## 2023-06-05 MED ADMIN — triamcinolone acetonide (KENALOG-40) injection 40 mg: 40 mg | @ 21:00:00 | Stop: 2023-06-05

## 2023-06-05 NOTE — Unmapped (Signed)
 Meet your team:     Your intake nurse is: Conservator, museum/gallery    Please remember to fill out the survey you will receive after your visit. Your comments help [place] continue to improve our care.      Thanks in advance!      [facility] Dermatology Clinical Staff

## 2023-06-05 NOTE — Unmapped (Signed)
 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 Ancil Linsey); LOV 04/2023  - Stopped Humira 80 mg weekly 03/2023. Transitioned to inflectra and avana with rheum.   - Per patient report, she recently had significant infusion reaction to inflectra and is now scheduled for simponi infusions starting in April 2025  - Continue biologic management (simponi, avana) with rheumatology   - Continue clindamycin (CLEOCIN) 300 MG capsule; Take 1 capsule (300 mg total) by mouth two (2) times a day.   - Continue rifAMPin (RIFADIN) 300 MG capsule; Take 1 capsule (300 mg total) by mouth two (2) times a day.  - Referral sent to Dr. Orvan Falconer today for surigcal consultation   - Can consider bimzelx or addition of methotrexate (previously stopped due to GI upset, consider leucovorin in future) in the future.    Intralesional Kenalog Procedure Note: After the patient was informed of risks (including atrophy and dyspigmentation), benefits and side effects of intralesional steroid injection, the patient elected to undergo injection and verbal consent was obtained. Skin was cleaned with alcohol and injected intralesionally into the sites (below). The patient tolerated the procedure well without complications and was instructed on post-procedure care.  Location(s): L breast  Number of sites treated: 1  Kenalog (triamcinolone) Concentration: 40 mg/ml   Volume: 0.4 ml total      High Risk Medication Use (Humira)  - CBC, CMP, ESR, CRP 03/2023 - WNL  - Quantiferon Gold and Hep B/C panel obtained on 11/21/2022 - Negative    The patient was advised to call for an appointment should any new, changing, or symptomatic lesions develop.     RTC: Return in about 4 months (around 10/05/2023) for follow up of HS. or sooner as needed   _________________________________________________________________      Chief Complaint     Chief Complaint   Patient presents with     Hidradenitis suppurativa     Pt coming in for HS follow up  Concern about problem areas under the arms, breast, stomach, groin and buttlocks  Flares and open wounds        HPI     Diana Brown is a 23 y.o. female who presents as a returning patient (last seen by Charlsie Quest, PA on 02/20/2023) to Dermatology for follow up of HS. At last visit, patient was to increase humira 80 mg injections weekly, start amoxicillin-clavulanate 875-125 mg tablets, taking 1 tablet by mouth twice daily, and start prednisone 10 mg tablet taper.     Today, patient reports that she is currently stopping inflectra due to an infusion reaction on 05/23/2023 accompanied by itching and hives. She states that her rheumatologist switched her to Simponi infusions (which she has not yet started). Patient reports having her infusions at [facility] health infusions in [place]. She is also taking arava orally every day for her arthritis.     For her HS, patient notes that she is also using clindamycin swabs and triamcinolone ointment to treat her flares topically. She reports that she discontinued use of Humira given it was ineffective. She reports an active flare under the breast. She reports that her underarms are the area she cares about the most. Patient  also taking oral clindamycin and rifampin twice daily.    The patient denies any other new or changing lesions or areas of concern.     Pertinent Past Medical History     No history of skin cancer    History of JRA (juvenile rheumatoid arthritis) (CMS-HCC)     Family History:   Negative for melanoma    Past Medical History, Family History, Social History, Medication List, Allergies, and Problem List were reviewed in the rooming section of Epic.     ROS: Other than symptoms mentioned in the HPI, no fevers, chills, or other skin complaints    Physical Examination     GENERAL: Well-appearing female in no acute distress, resting comfortably.  NEURO: Alert and oriented, answers questions appropriately  PSYCH: Normal mood and affect  SKIN: Examination of the axillae, breasts,and groin was performed  - Multiple draining sinuses and inflamed nodules of the bilateral axillae, inframammary folds  - Nondraining sinuses and inflamed nodules of the groin, infrapannus    All areas not commented on are within normal limits or unremarkable    Scribe's Attestation: Lowry Bowl, PA-C obtained and performed the history, physical exam and medical decision making elements that were entered into the chart.  Signed by Allayne Stack, Scribe, on June 05, 2023 at 3:27 PM.    ----------------------------------------------------------------------------------------------------------------------  June 06, 2023 8:18 AM. Documentation assistance provided by the Scribe. I was present during the time the encounter was recorded. The information recorded by the Scribe was done at my direction and has been reviewed and validated by me.  ----------------------------------------------------------------------------------------------------------------------      (Approved Template 12/13/2019)

## 2023-06-19 DIAGNOSIS — L732 Hidradenitis suppurativa: Principal | ICD-10-CM

## 2023-06-19 MED ORDER — CLINDAMYCIN HCL 300 MG CAPSULE
ORAL_CAPSULE | Freq: Two times a day (BID) | ORAL | 0 refills | 90.00 days | Status: CP
Start: 2023-06-19 — End: ?

## 2023-07-08 NOTE — Unmapped (Signed)
 Specialty Medication(s): Humira    Ms.Alessandrini has been dis-enrolled from the [facility] specialty pharmacy services as a result of a change in therapy. The patient is now taking inflectra and is not filling at the [facility].    Additional information provided to the patient: per 3/6 Derm note    Archana Eckman [facility] Specialty Pharmacist

## 2023-07-14 DIAGNOSIS — L732 Hidradenitis suppurativa: Principal | ICD-10-CM

## 2023-07-24 ENCOUNTER — Emergency Department (HOSPITAL_COMMUNITY)
Admission: EM | Admit: 2023-07-24 | Discharge: 2023-07-24 | Disposition: A | Discharge status: Home / Self Care | Attending: Emergency Medicine | Admitting: Emergency Medicine

## 2023-07-24 ENCOUNTER — Encounter (HOSPITAL_COMMUNITY): Payer: Self-pay | Admitting: *Deleted

## 2023-07-24 ENCOUNTER — Other Ambulatory Visit: Payer: Self-pay

## 2023-07-24 DIAGNOSIS — T7840XA Allergy, unspecified, initial encounter: Secondary | ICD-10-CM | POA: Insufficient documentation

## 2023-07-24 MED ORDER — PREDNISONE 20 MG PO TABS: 40.0000 mg | ORAL_TABLET | Freq: Every day | ORAL | 0 refills | Status: AC

## 2023-07-24 MED ORDER — METHYLPREDNISOLONE SODIUM SUCC 125 MG IJ SOLR
125.0000 mg | Freq: Once | INTRAMUSCULAR | Status: AC
  Administered 2023-07-24: 125 mg via INTRAVENOUS
  Filled 2023-07-24: qty 2

## 2023-07-24 MED ORDER — DIPHENHYDRAMINE HCL 50 MG/ML IJ SOLN
25.0000 mg | Freq: Once | INTRAMUSCULAR | Status: AC
  Administered 2023-07-24: 25 mg via INTRAVENOUS
  Filled 2023-07-24: qty 1

## 2023-07-24 MED ORDER — FAMOTIDINE IN NACL 20-0.9 MG/50ML-% IV SOLN
20.0000 mg | Freq: Once | INTRAVENOUS | Status: AC
  Administered 2023-07-24: 20 mg via INTRAVENOUS
  Filled 2023-07-24: qty 50

## 2023-07-24 NOTE — ED Provider Triage Note (Signed)
 Emergency Medicine Provider Triage Evaluation Note  Angie Stone , a 23 y.o. female  was evaluated in triage.  Pt complains of hives.  Onset yesterday afternoon.  Has tried benadryl  without relief.  No n/v/d.  Denies SOB or throat swelling sensation.  Review of Systems  Positive: hives Negative: nvd  Physical Exam  BP (!) 155/102 (BP Location: Left Leg)   Pulse 98   Temp 98.1 F (36.7 C)   Resp (!) 24   SpO2 92%  Gen:   Awake, no distress   Resp:  Normal effort  MSK:   Moves extremities without difficulty  Other:  Hives present  Medical Decision Making  Medically screening exam initiated at 2:39 AM.  Appropriate orders placed.  Velisa Dimond was informed that the remainder of the evaluation will be completed by another provider, this initial triage assessment does not replace that evaluation, and the importance of remaining in the ED until their evaluation is complete.     Sherel Dikes, PA-C 07/24/23 [phone]

## 2023-07-24 NOTE — ED Triage Notes (Signed)
 The pt has an allergic reaction to something t that started at 1600 she took 50 mg of benadryl  body rash and itching  at 2200 she took another 50 mg of benadryl   lmp depo

## 2023-07-24 NOTE — ED Provider Notes (Signed)
 [facility] Emergency Department Provider Note MRN:  [id number]  Arrival date & time: 07/24/23     Chief Complaint   Allergic Reaction   History of Present Illness   Angie Stone is a 23 y.o. year-old female presents to the ED with chief complaint of hives.  She states that she developed the hives around 4 PM yesterday afternoon.  She took Benadryl  without much improvement.  She still complains of itching.  She denies any shortness of breath, nausea, vomiting, diarrhea, throat swelling sensation.  She denies any other associated symptoms..  History provided by patient.   Review of Systems  Pertinent positive and negative review of systems noted in HPI.    Physical Exam   Vitals:   07/24/23 0230 07/24/23 0403  BP: (!) 155/102 107/81  Pulse: 98 (!) 118  Resp: (!) 24 18  Temp: 98.1 F (36.7 C) 98.4 F (36.9 C)  SpO2: 92% 100%    CONSTITUTIONAL:  non toxic-appearing, NAD NEURO:  Alert and oriented x 3, CN 3-12 grossly intact EYES:  eyes equal and reactive ENT/NECK:  Supple, no stridor, normal speech, toleration secretions CARDIO:  normal rate on my exam, regular rhythm, appears well-perfused  PULM:  No respiratory distress, CTAB GI/GU:  non-distended,  MSK/SPINE:  No gross deformities, no edema, moves all extremities  SKIN:  no rash, atraumatic   *Additional and/or pertinent findings included in MDM below  Diagnostic and Interventional Summary    EKG Interpretation Date/Time:    Ventricular Rate:    PR Interval:    QRS Duration:    QT Interval:    QTC Calculation:   R Axis:      Text Interpretation:         Labs Reviewed - No data to display  No orders to display    Medications  famotidine  (PEPCID ) IVPB 20 mg premix (0 mg Intravenous Stopped 07/24/23 0402)  diphenhydrAMINE  (BENADRYL ) injection 25 mg (25 mg Intravenous Given 07/24/23 0306)  methylPREDNISolone  sodium succinate (SOLU-MEDROL ) 125 mg/2 mL injection 125 mg (125 mg  Intravenous Given 07/24/23 0313)     Procedures  /  Critical Care Procedures  ED Course and Medical Decision Making  I have reviewed the triage vital signs, the nursing notes, and pertinent available records from the EMR.  Social Determinants Affecting Complexity of Care: Patient has no clinically significant social determinants affecting this chief complaint..   ED Course:    Medical Decision Making Patient here with hives.  Uncertain cause.  She had little improvement with Benadryl  last night.  She comes in due to the persistent itching.  She does not have any other systemic symptoms, doubt anaphylactic reaction.  Will give Solu-Medrol , Pepcid , Benadryl .  Will reassess.  RN notified me that patient would like to be discharged.  She states that she is feeling better.  I reassessed the patient, the hives are improved and patient feels well.  Will discharge home on short course of prednisone .  Risk Prescription drug management.         Consultants: No consultations were needed in caring for this patient.   Treatment and Plan: Emergency department workup does not suggest an emergent condition requiring admission or immediate intervention beyond  what has been performed at this time. The patient is safe for discharge and has  been instructed to return immediately for worsening symptoms, change in  symptoms or any other concerns    Final Clinical Impressions(s) / ED Diagnoses     ICD-10-CM  1. Allergic reaction, initial encounter  T78.40XA       ED Discharge Orders          Ordered    predniSONE  (DELTASONE ) 20 MG tablet  Daily        07/24/23 0446              Discharge Instructions Discussed with and Provided to Patient:   Discharge Instructions   None      Sherel Dikes, PA-C 07/24/23 0540    Earma Gloss, MD 07/24/23 [phone]

## 2023-07-24 NOTE — ED Notes (Signed)
 The pt stopped itching from the first iv benadryl 

## 2023-08-04 DIAGNOSIS — L732 Hidradenitis suppurativa: Principal | ICD-10-CM

## 2023-08-04 MED ORDER — PREDNISONE 10 MG TABLET
ORAL_TABLET | ORAL | 0 refills | 28.00000 days | Status: CP
Start: 2023-08-04 — End: 2023-09-01

## 2023-08-13 ENCOUNTER — Ambulatory Visit: Admit: 2023-08-13 | Discharge: 2023-08-14 | Payer: Medicaid (Managed Care)

## 2023-08-13 DIAGNOSIS — L732 Hidradenitis suppurativa: Principal | ICD-10-CM

## 2023-08-14 DIAGNOSIS — L732 Hidradenitis suppurativa: Principal | ICD-10-CM

## 2023-08-21 ENCOUNTER — Encounter (HOSPITAL_BASED_OUTPATIENT_CLINIC_OR_DEPARTMENT_OTHER): Attending: General Surgery | Admitting: General Surgery

## 2023-08-21 DIAGNOSIS — L732 Hidradenitis suppurativa: Secondary | ICD-10-CM | POA: Insufficient documentation

## 2023-09-11 DIAGNOSIS — L732 Hidradenitis suppurativa: Principal | ICD-10-CM

## 2023-09-11 DIAGNOSIS — Z79899 Other long term (current) drug therapy: Principal | ICD-10-CM

## 2023-09-11 MED ORDER — DOXYCYCLINE HYCLATE 100 MG CAPSULE
ORAL_CAPSULE | Freq: Two times a day (BID) | ORAL | 2 refills | 30.00000 days | Status: CP
Start: 2023-09-11 — End: ?

## 2023-09-11 MED ORDER — CLINDAMYCIN 1 % LOTION
TOPICAL | 11 refills | 0.00000 days | Status: CP
Start: 2023-09-11 — End: ?

## 2023-09-11 MED ORDER — MUPIROCIN 2 % TOPICAL OINTMENT
TOPICAL | 1 refills | 0.00000 days | Status: CP
Start: 2023-09-11 — End: ?

## 2023-09-11 MED ORDER — BIMZELX AUTOINJECTOR 320 MG/2 ML SUBCUTANEOUS AUTO-INJECTOR
SUBCUTANEOUS | 0 refills | 0.00000 days | Status: CP
Start: 2023-09-11 — End: ?

## 2023-09-12 DIAGNOSIS — L732 Hidradenitis suppurativa: Principal | ICD-10-CM

## 2023-09-19 DIAGNOSIS — L732 Hidradenitis suppurativa: Principal | ICD-10-CM

## 2023-09-19 MED ORDER — BIMZELX AUTOINJECTOR 320 MG/2 ML SUBCUTANEOUS AUTO-INJECTOR
0 refills | 0.00000 days | Status: CP
Start: 2023-09-19 — End: ?
  Filled 2023-09-23: qty 8, 56d supply, fill #0

## 2023-09-19 NOTE — Unmapped (Signed)
 [facility] Specialty Medication Onboarding    Specialty Medication: Bimzelx  Autoinjector 320mg /5mL (Loading)  Prior Authorization: Approved   Financial Assistance: No - copay  <$25  Final Copay/Day Supply: $4 / 56 days    Insurance Restrictions: None     Notes to Pharmacist: Insurance will require cost exceeds max for greater than 56ds. Rx written for 10mL, but needs to be written for 16-22mL per directions to complete load. Message sent to prescriber to correct. Can dispense 2 x 56ds prior to beginning maintenance  Credit Card on File: no  Start Date on Rx:  N/A    [facility] Specialty Medication Onboarding    Specialty Medication: Bimzelx  Autoinjector 320mg /64mL (Maintenance)  Prior Authorization: Approved   Financial Assistance: No - copay  <$25  Final Copay/Day Supply: $4 / 28 days    Insurance Restrictions: None     Notes to Pharmacist: N/A    The triage team has completed the benefits investigation and has determined that the patient is able to fill this medication at [facility]. Please contact the patient to complete the onboarding or follow up with the prescribing physician as needed.

## 2023-09-19 NOTE — Unmapped (Signed)
 [facility]    Patient Onboarding/Medication Counseling    Diana Brown is a 23 y.o. female with hidradenitis suppurativa who I am counseling today on initiation of therapy.  I am speaking to the patient.    Was a Nurse, learning disability used for this call? No    Verified patient's date of birth / HIPAA.    Specialty medication(s) to be sent: Inflammatory Disorders: Bimzelx       Non-specialty medications/supplies to be sent: Sharps      Medications not needed at this time: n/a         Bimzelx  (bimekizumab )  The patient declined counseling on medication administration, missed dose instructions, goals of therapy, side effects and monitoring parameters, warnings and precautions, drug/food interactions, and storage, handling precautions, and disposal because they were counseled in clinic. The information in the declined sections below are for informational purposes only and was not discussed with patient.     Medication & Administration     Dosage: Hidradenitis Suppurativa: Inject 320 mg at Week 0, 2, 4, 6, 8, 10, 12, 14 and 16, then every 4 weeks thereafter      Lab tests required prior to treatment initiation:  Tuberculosis: Tuberculosis screening resulted in a non-reactive Quantiferon TB Gold assay. 11/21/2022  Liver Function Tests: LFTs, Alk Phos, and bilirubin are documented in the patient's chart. 07/28/2023      Administration:     Prefilled auto-injector pen  - 320 mg pen.  Gather all supplies needed for injection on a clean, flat working surface: medication pen removed from packaging, alcohol swab, sharps container, etc.  Look at the medication label - look for correct medication, correct dose, and check the expiration date  Check the medicine through the viewing window. The medicine should be slightly pearly and colorless to pale brownish-yellow and free of particles. You may see air bubbles in the liquid. This is normal. Do not used the pre-filled pen if the medicine is cloudy, discolored, or has particles.   Lay the auto-injector pen on a flat surface and allow it to warm up to room temperature for at least 30-45 minutes  Select injection site - you can use the front of your thigh or your belly (but not the area 2 inches around your belly button); if someone else is giving you the injection you can also use your upper arm in the skin covering your triceps muscle  Prepare injection site - wash your hands and clean the skin at the injection site with an alcohol swab and let it air dry, do not touch the injection site again before the injection  Pull off the safety cap, do not remove until immediately prior to injection and do not touch the green needle cover  Put the green needle cover against your skin at the injection site at a 90 degree angle, hold the pen such that you can see the clear medication window  Press down and hold the pen firmly against your skin, there will be a click when the injection starts  Continue to hold the pen firmly against your skin for about 15 seconds - the window will start to turn solid yellow  There will be a second click sound when the injection is almost complete, verify the window is solid yellow to indicate the injection is complete and then pull the pen away from your skin  Dispose of the used auto-injector pen immediately in your sharps disposal container the needle will be covered automatically  If  you see any blood at the injection site, press a cotton ball or gauze on the site and maintain pressure until the bleeding stops, do not rub the injection site    Prefilled syringe  1. Gather all supplies needed for injection on a clean, flat working surface: medication syringe(s) removed from packaging, alcohol swab, sharps container, etc.  2. Look at the medication label - look for correct medication, correct dose, and check the expiration date  3. Look at the medication - the liquid in the syringe should appear slightly pearly and colorless to pale brownish-yellow  4. Lay the syringe on a flat surface and allow it to warm up to room temperature for at least 30-45 minutes  5. Select injection site - you can use the front of your thigh or your belly (but not the area 2 inches around your belly button)  6. Prepare injection site - wash your hands and clean the skin at the injection site with an alcohol swab and let it air dry, do not touch the injection site again before the injection  7. Pull off the needle safety cap, do not remove until immediately prior to injection  8. Pinch the skin - with your hand not holding the syringe pinch up a fold of skin at the injection site using your forefinger and thumb  9. Insert the needle into the fold of skin at about a 45 degree angle - it's best to use a quick dart-like motion  10.Push the plunger down slowly as far as it will go until the syringe is empty  11. Check that the syringe is empty then remove pressure from plunger head. This will cause the needle to automatically retract and lock.    12. Dispose of the used syringe immediately in your sharps disposal container, do not attempt to recap the needle prior to disposing  13. If you see any blood at the injection site, press a cotton ball or gauze on the site and maintain pressure until the bleeding stops, do not rub the injection site      Adherence/Missed dose instructions:  If your injection is given more than 4 days after your scheduled injection date - consult your pharmacist for additional instructions on how to adjust your dosing schedule.        Goals of Therapy     Plaque Psoriasis  Minimize areas of skin involvement (% BSA)  Avoidance of long term glucocorticoid use  Maintenance of effective psychosocial functioning    Hidradenitis Suppurativa  Reduce the frequency and severity of new lesions  Minimize pain and suppuration  Prevent disease progression and limit scarring  Maintenance of effective psychosocial functioning      Side Effects & Monitoring Parameters     Injection site reaction (redness, irritation, inflammation localized to the site of administration)  Signs of a common cold - minor sore throat, runny or stuffy nose, etc.  Diarrhea  Mood changes/suicidal ideation  Liver Function Tests    The following side effects should be reported to the provider:  Signs of a hypersensitivity reaction - rash; hives; itching; red, swollen, blistered, or peeling skin; wheezing; tightness in the chest or throat; difficulty breathing, swallowing, or talking; swelling of the mouth, face, lips, tongue, or throat; etc.  Reduced immune function - report signs of infection such as fever; chills; body aches; very bad sore throat; ear or sinus pain; cough; more sputum or change in color of sputum; pain with passing urine; wound that will  not heal, etc.  Also at a slightly higher risk of some malignancies (mainly skin and blood cancers) due to this reduced immune function.  In the case of signs of infection - the patient should hold the next dose of Bimzelx ?? and call your primary care provider to ensure adequate medical care.  Treatment may be resumed when infection is treated and patient is asymptomatic.  Muscle pain or weakness  Shortness of breath      Warnings, Precautions, & Contraindications     Have your bloodwork checked as you have been told by your prescriber  Talk with your doctor if you are pregnant, planning to become pregnant, or breastfeeding  Discuss the possible need for holding your dose(s) of Bimzelx ?? when a planned procedure is scheduled with the prescriber as it may delay healing/recovery timeline       Drug/Food Interactions     Medication list reviewed in Epic. The patient was instructed to inform the care team before taking any new medications or supplements. No drug interactions identified.   Talk with you prescriber or pharmacist before receiving any live vaccinations while taking this medication and after you stop taking it      Storage, Handling Precautions, & Disposal Store this medication in the refrigerator.  Do not freeze  May store intact Bimzelx  pens and 160 mg/mL prefilled syringes at <=25??C (<=77??F) for up to 30 days; do NOT return to the refrigerator   Store in original packaging, protected from light  Do not shake  Dispose of used syringes/pens in a sharps disposal container           Current Medications (including OTC/herbals), Comorbidities and Allergies     Current Medications[1]    Allergies[2]    Problem List[3]    Medication list has been reviewed and updated in Epic: Yes    Allergies have been reviewed and updated in Epic: Yes    Appropriateness of Therapy     Acute infections noted within Epic:  No active infections  Patient reported infection: None    Is the medication and dose appropriate based on diagnosis, medication list, comorbidities, allergies, medical history, patient???s ability to self-administer the medication, and therapeutic goals? Yes    Prescription has been clinically reviewed: Yes      Baseline Quality of Life Assessment      How many days over the past month did your condition  keep you from your normal activities? For example, brushing your teeth or getting up in the morning. 0    Financial Information     Medication Assistance provided: Prior Authorization    Anticipated copay of $4 / 56DS (LD) and $4 / 28DS (MD) reviewed with patient. Verified delivery address.    Delivery Information     Scheduled delivery date: 09/24/2023    Expected start date: 09/24/2023      Medication will be delivered via UPS to the prescription address in [facility].  This shipment will not require a signature.      Explained the services we provide at [facility] and that each month we would call to set up refills.  Stressed importance of returning phone calls so that we could ensure they receive their medications in time each month.  Informed patient that we should be setting up refills 7-10 days prior to when they will run out of medication.  A pharmacist will reach out to perform a clinical assessment periodically.  Informed patient that a welcome packet,  containing information about our pharmacy and other support services, a Notice of Privacy Practices, and a drug information handout will be sent.      The patient or caregiver noted above participated in the development of this care plan and knows that they can request review of or adjustments to the care plan at any time.      Patient or caregiver verbalized understanding of the above information as well as how to contact the pharmacy at [phone] option 4 with any questions/concerns.  The pharmacy is open Monday through Friday 8:30am-4:30pm.  A pharmacist is available 24/7 via pager to answer any clinical questions they may have.    Patient Specific Needs     Does the patient have any physical, cognitive, or cultural barriers? No    Does the patient have adequate living arrangements? (i.e. the ability to store and take their medication appropriately) Yes    Did you identify any home environmental safety or security hazards? No    Patient prefers to have medications discussed with  Patient     Is the patient or caregiver able to read and understand education materials at a high school level or above? Yes    Patient's primary language is  English     Is the patient high risk? No    Does the patient have an additional or emergency contact listed in their chart? Yes    SOCIAL DETERMINANTS OF HEALTH     At the [facility], we have learned that life circumstances - like trouble affording food, housing, utilities, or transportation can affect the health of many of our patients.   That is why we wanted to ask: are you currently experiencing any life circumstances that are negatively impacting your health and/or quality of life? No    Social Drivers of Health     Food Insecurity: Patient Declined (06/29/2023)    Received from [facility]    Hunger Vital Sign     Within the past 12 months, you worried that your food would run out before you got the money to buy more.: Patient declined     Within the past 12 months, the food you bought just didn't last and you didn't have money to get more.: Patient declined   Tobacco Use: Low Risk  (09/11/2023)    Patient History     Smoking Tobacco Use: Never     Smokeless Tobacco Use: Never     Passive Exposure: Not on file   Transportation Needs: No Transportation Needs (06/29/2023)    Received from [facility] - Transportation     Lack of Transportation (Medical): No     Lack of Transportation (Non-Medical): No   Alcohol Use: Not At Risk (06/29/2023)    Received from [facility]    AUDIT-C     Q1: How often do you have a drink containing alcohol?: Monthly or less     Q2: How many drinks containing alcohol do you have on a typical day when you are drinking?: 1 or 2     Q3: How often do you have six or more drinks on one occasion?: Never   Housing: Unknown (06/29/2023)    Received from [facility] Stability Vital Sign     In the last 12 months, was there a time when you were not able to pay the mortgage or rent on time?: Patient declined     In the past 12 months,  how many times have you moved where you were living?: 0     At any time in the past 12 months, were you homeless or living in a shelter (including now)?: No   Physical Activity: Insufficiently Active (06/29/2023)    Received from [facility]    Exercise Vital Sign     On average, how many days per week do you engage in moderate to strenuous exercise (like a brisk walk)?: 2 days     On average, how many minutes do you engage in exercise at this level?: 30 min   Utilities: Not At Risk (06/29/2023)    Received from [facility] Utilities     Threatened with loss of utilities: No   Stress: No Stress Concern Present (06/29/2023)    Received from [facility] of Occupational Health - Occupational Stress Questionnaire     Feeling of Stress : Only a little   Interpersonal Safety: Not on file   Substance Use: Not on file (02/03/2023)   Intimate Partner Violence: Not At Risk (06/29/2023)    Received from [facility]    HITS     Over the last 12 months how often did your partner physically hurt you?: Never     Over the last 12 months how often did your partner insult you or talk down to you?: Never     Over the last 12 months how often did your partner threaten you with physical harm?: Never     Over the last 12 months how often did your partner scream or curse at you?: Never   Social Connections: Somewhat Isolated (06/29/2023)    Received from [facility]    Social Network     How would you rate your social network (family, work, friends)?: Restricted participation with some degree of social isolation   Physicist, medical Strain: Low Risk  (06/29/2023)    Received from [employer]    Overall Financial Resource Strain (CARDIA)     Difficulty of Paying Living Expenses: Not very hard   Health Literacy: Not on file   Internet Connectivity: Not on file       Would you be willing to receive help with any of the needs that you have identified today? No       Velma Ober, PharmD  [facility] Specialty Pharmacist       [1]   Current Outpatient Medications   Medication Sig Dispense Refill    bimekizumab -bkzx (BIMZELX  AUTOINJECTOR) 320 mg/2 mL AtIn Inject the contents of 1 pen (320 mg) under the skin every twenty-eight (28) days. 2 mL 6    bimekizumab -bkzx (BIMZELX  AUTOINJECTOR) 320 mg/2 mL AtIn Inject the contents of 1 pen (320 mg) under the skin on weeks 0, 2, 4, 6, 8, 10, 12, 14, and 16. Loading dose. 18 mL 0    cefdinir  (OMNICEF ) 300 MG capsule Take 1 capsule (300 mg total) by mouth two (2) times a day. 60 capsule 2    clindamycin  (CLEOCIN  T) 1 % lotion Apply 1-2 times daily to affected areas of HS lesions 60 mL 11    clindamycin  (CLEOCIN  T) 1 % Swab Apply 1 Application topically two (2) times a day. 60 each 6    clindamycin  (CLEOCIN ) 300 MG capsule Take 1 capsule (300 mg total) by mouth two (2) times a day. 180 capsule 0    doxycycline  (MONODOX ) 100 MG capsule  (Patient not  taking: Reported on 11/27/2022)      doxycycline  (VIBRAMYCIN ) 100 MG capsule Take 1 capsule (100 mg total) by mouth two (2) times a day. Take with food and a full glass of water. Stay upright for 30 minutes after taking. Sun-protect. 60 capsule 2    empty container (SHARPS-A-GATOR DISPOSAL SYSTEM) Misc Use as directed 1 each 2    moxifloxacin  (AVELOX ) 400 mg tablet Take 1 tablet (400 mg total) by mouth daily. (Patient not taking: Reported on 11/27/2022) 90 tablet 0    mupirocin  (BACTROBAN ) 2 % ointment Apply topically up to three times to affected areas until healed 30 g 1    rifAMPin  (RIFADIN ) 300 MG capsule Take 1 capsule (300 mg total) by mouth two (2) times a day. 180 capsule 0    triamcinolone  (KENALOG ) 0.1 % ointment Apply twice daily to affected HS areas for 7 days as needed for flares 80 g 0     No current facility-administered medications for this visit.   [2]   Allergies  Allergen Reactions    Infliximab  Hives and Itching    Sulfa (Sulfonamide Antibiotics) Rash    Sulfasalazine  Rash, Hives and Itching    Golimumab     Infliximab -Dyyb    [3]   Patient Active Problem List  Diagnosis    Presence of both artificial hip joints    Personal history of immunosupression therapy    Polyarticular juvenile idiopathic arthritis       High risk medication use    Nonintractable headache    Vitamin D  deficiency    Hidradenitis suppurativa

## 2023-09-23 MED FILL — EMPTY CONTAINER: 120 days supply | Qty: 1 | Fill #1

## 2023-10-06 NOTE — Unmapped (Signed)
 Date of Service: 08/14/2023  Patient Name: Diana Brown  DOB: 12-30-2000 (23 y.o. female)  Phone: [phone]    Primary Care Provider:  Jacques Camie Pepper, [facility]  [address] [place] [address]    Referring Provider:  Pcp, Information Unavailable  No address on file   [place]  SURGERY    Cyndee LITTIE Shams, MD, [facility]  [facility]  [address] [place] [address]  Phone: [phone]  Fax: [phone]     ASSESSMENT - PLAN     No diagnosis found.      No orders of the defined types were placed in this encounter.         History of Present Illness  Diana Brown is a 23 year old female with hidradenitis suppurativa who presents for surgical consultation regarding potential unroofing procedures. She is accompanied by her mother. She was referred by the dermatology team in [facility] for surgical evaluation of her hidradenitis suppurativa.    She has been experiencing hidradenitis suppurativa for several years, primarily affecting her underarms and groin area. The condition has led to inflammation, discomfort, and drainage, significantly impacting her quality of life. The groin area is particularly bothersome, and she has not undergone any surgical procedures for this condition previously. Her current treatment includes medications managed by the dermatology team.    She recently graduated from college and is planning to pursue a master's degree in social work, with internships starting around September.      Concern about problem areas under the arms, breast, stomach, groin and buttlocks reported from referred dermatology team.    Management Plan / Recommendations :    Assessment & Plan  Hidradenitis suppurativa  Chronic multifocal hidradenitis suppurativa involving the underarms, groin, and lower abdominal wall. The left groin is particularly affected with clustered lesions that may benefit from surgical intervention. She has not undergone previous surgical treatment. Unroofing was discussed as a surgical option, which involves removing the top layer of skin over affected areas to allow scar tissue formation, reducing recurrence. This procedure is not curative but can significantly alleviate symptoms and enhance quality of life. Risks include discomfort, large open wounds, and the necessity for daily dressing changes, with a healing period of approximately three months. Benefits include potential reduction in active disease and drainage. Shared decision-making focused on the extent of surgery, initially targeting the left and right groin, with future consideration for the underarms. She was informed that this procedure is typically better tolerated than radiation or solely medical management, and that combining surgery with medication is more effective in reducing active drainage.  - Schedule surgery for early July to address the left and right groin areas.  - Discuss the extent of surgery, starting with the left groin to assess tolerance.  - Provide digital follow-up options for post-operative care, including sending pictures and phone consultations.  - Advise on daily dressing changes post-surgery with Vaseline, gauze, and tape.  - Discuss potential future surgeries for other affected areas based on symptoms and her preference.    Physical Exam  SKIN: Multifocal hidradenitis in the right axilla, hair-bearing area, measuring 4x4 cm; Multifocal hidradenitis in the left axilla, hair-bearing area; Multifocal hidradenitis in the suprapubic area, measuring 8x12 cm.; Multifocal hidradenitis in the bilateral inner thighs, more pronounced on the left.  GENERAL: This is a well-developed, well-nourished female, not in acute distress, appears comfortable  HEENT: Head is normocephalic and atraumatic. Extraocular muscles are intact. No scleral icterus. Oral  mucosa moist and pink without erythema or exudate.  NECK: Supple, no JVD. No cervical lymphadenopathy.  THYROID: No palpable masses  LUNGS: Normal work of breathing with equal chest wall expansion.  BREASTS: Deferred  HEART: Regular rate and rhythm.  Good peripheral perfusion  RECTAL EXAM: Deferred  EXTREMITIES: No clubbing, cyanosis or edema noted.  Normal gait.  NEUROLOGIC: No focal neurologic deficits. Cranial nerves II through XII are grossly intact. Sensation is intact throughout.  Steady gait.  PSYCH:  Normal affect, Alert and Oriented x 3.      We reviewed the medications.  We reviewed recent available imaging and referral provider documentation.  We discussed the impression and plan going forward.  I answered all of the questions.    No follow-ups on file.      This note was generated using speech recognition software and may contain word substitutions or errors.    ROS   ROS       REASON FOR VISIT     08/14/2023:  No chief complaint on file.        HISTORY     PMH: She has Presence of both artificial hip joints; Personal history of immunosupression therapy; Polyarticular juvenile idiopathic arthritis   ; High risk medication use; Nonintractable headache; Vitamin D  deficiency; and Hidradenitis suppurativa on their problem list.   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     FH: Her family history includes Anemia in her mother; Arthritis in her paternal grandmother; Diabetes in her maternal grandmother, maternal uncle, mother, paternal grandfather, and paternal grandmother; Lupus in her father and paternal grandmother.   SH: She  reports that she has never smoked. She has never used smokeless tobacco. She reports that she does not currently use alcohol. She reports that she does not use drugs.     Home Address  [address]  [place] [address]   Employer  Not Employed     Emergency Contacts  Emergency Contacts    None on File       Other Contacts       Name Relation Home Work [place] Mother  [phone]     Lynwood Goodell Father   [phone]             MEDICATIONS     After Visit Medications  Current Outpatient Medications   Medication Instructions    bimekizumab -bkzx (BIMZELX  AUTOINJECTOR) 320 mg/2 mL AtIn Inject the contents of 1 pen (320 mg) under the skin every twenty-eight (28) days.    bimekizumab -bkzx (BIMZELX  AUTOINJECTOR) 320 mg/2 mL AtIn Inject the contents of 1 pen (320 mg) under the skin on weeks 0, 2, 4, 6, 8, 10, 12, 14, and 16. Loading dose.    cefdinir  (OMNICEF ) 300 mg, Oral, 2 times a day (standard)    clindamycin  (CLEOCIN  T) 1 % lotion Apply 1-2 times daily to affected areas of HS lesions    clindamycin  (CLEOCIN  T) 1 % Swab 1 Application, Topical, 2 times a day (standard)    clindamycin  (CLEOCIN ) 300 mg, Oral, 2 times a day    doxycycline  (VIBRAMYCIN ) 100 mg, Oral, 2 times a day (standard), Take with food and a full glass of water. Stay upright for 30 minutes after taking. Sun-protect.    empty container (SHARPS-A-GATOR DISPOSAL SYSTEM) Misc Use as directed    mupirocin  (BACTROBAN ) 2 % ointment Apply topically up to three times  to affected areas until healed    rifAMPin  (RIFADIN ) 300 mg, Oral, 2 times a day (standard)    triamcinolone  (KENALOG ) 0.1 % ointment Apply twice daily to affected HS areas for 7 days as needed for flares    New Prescriptions    No medications on file     Modified Medications    No medications on file     Discontinued Medications    No medications on file      Allergies  She is allergic to infliximab , sulfa (sulfonamide antibiotics), sulfasalazine , golimumab, and infliximab -dyyb.      RISK FACTORS - VITALS     Vitals  There were no vitals taken for this visit.      Blood pressure  BP Readings from Last 6 Encounters:   08/13/23 151/101   03/15/20 114/82   02/15/20 127/83   02/15/20 135/82   01/18/20 101/68   12/21/19 113/73         Glucose  No results found for: A1C, GLUCOSE   Weight  There is no height or weight on file to calculate BMI.  Wt Readings from Last 6 Encounters:   08/13/23 93 kg (205 lb)   03/15/20 113.9 kg (251 lb 3.2 oz) (>99%, Z= 2.46)*   02/15/20 117.5 kg (259 lb 0.7 oz) (>99%, Z= 2.52)*   02/15/20 117.5 kg (259 lb 0.7 oz) (>99%, Z= 2.52)*   01/18/20 116.1 kg (256 lb) (>99%, Z= 2.49)*   12/21/19 115.1 kg (253 lb 12.8 oz) (>99%, Z= 2.48)*     * Growth percentiles are based on CDC (Girls, 2-20 Years) data.         LABS     Results      Lab Results   Component Value Date    WBC 7.8 11/21/2022    HGB 12.4 11/21/2022    MCV 85.8 11/21/2022    PLT 405 11/21/2022     No results found for: PT, PTT, INR, DDIMER   Lab Results   Component Value Date    NA 143 01/18/2020    K  01/18/2020      Comment:      Hemolyzed specimen.    CL 108 (H) 01/18/2020    CO2 26.5 (H) 01/18/2020    ANIONGAP 9 01/18/2020    BUN 6 (L) 11/21/2022    CREATININE 0.68 11/21/2022    EGFR >90 11/21/2022    GFRNAAF >90 01/18/2020    GFRAAF >90 01/18/2020    Lab Results   Component Value Date    PROT 7.2 01/18/2020    ALBUMIN 3.1 (L) 01/18/2020    BILITOT 0.2 01/18/2020    AST 16 11/21/2022    ALT 10 11/21/2022    ALKPHOS 69 01/18/2020       Lab Results   Component Value Date    CREATININE 0.68 11/21/2022    CREATININE 0.70 01/18/2020    CREATININE 0.68 11/23/2019    CREATININE 0.68 09/30/2019    CREATININE 0.59 08/10/2019    CREATININE 0.65 06/15/2019             Reviewed pertinent external/referral notes. Physical exam performed with pertinent findings noted in assessment / plan. Reviewed pertinent objective data / prior tests. Reviewed pertinent medications / prescription drug management.

## 2023-10-07 ENCOUNTER — Inpatient Hospital Stay: Admit: 2023-10-07 | Discharge: 2023-10-07 | Payer: Medicaid (Managed Care)

## 2023-10-07 ENCOUNTER — Encounter: Admit: 2023-10-07 | Discharge: 2023-10-07 | Payer: Medicaid (Managed Care)

## 2023-10-07 LAB — PREGNANCY, URINE: PREGNANCY TEST URINE: NEGATIVE

## 2023-10-07 MED ORDER — DOCUSATE SODIUM 100 MG CAPSULE
ORAL_CAPSULE | Freq: Two times a day (BID) | ORAL | 0 refills | 14.00000 days | Status: CP
Start: 2023-10-07 — End: 2023-10-21
  Filled 2023-10-07: qty 28, 14d supply, fill #0

## 2023-10-07 MED ORDER — ONDANSETRON 4 MG DISINTEGRATING TABLET
ORAL_TABLET | Freq: Three times a day (TID) | ORAL | 0 refills | 5.00000 days | Status: CP | PRN
Start: 2023-10-07 — End: 2023-10-12
  Filled 2023-10-07: qty 15, 5d supply, fill #0

## 2023-10-07 MED ORDER — HYDROCODONE 5 MG-ACETAMINOPHEN 325 MG TABLET
ORAL_TABLET | ORAL | 0 refills | 2.00000 days | Status: CP | PRN
Start: 2023-10-07 — End: 2023-10-12
  Filled 2023-10-07: qty 8, 2d supply, fill #0

## 2023-10-07 MED ADMIN — fentaNYL (PF) (SUBLIMAZE) injection: INTRAVENOUS | @ 12:00:00 | Stop: 2023-10-07

## 2023-10-07 MED ADMIN — midazolam (VERSED) injection: INTRAVENOUS | @ 11:00:00 | Stop: 2023-10-07

## 2023-10-07 MED ADMIN — bupivacaine (PF) (MARCAINE) 0.5 % (5 mg/mL) injection (PF): @ 12:00:00 | Stop: 2023-10-07

## 2023-10-07 MED ADMIN — lactated Ringers infusion: 10 mL/h | INTRAVENOUS | @ 11:00:00 | Stop: 2023-10-07

## 2023-10-07 MED ADMIN — dexAMETHasone (DECADRON) 4 mg/mL injection: INTRAVENOUS | @ 12:00:00 | Stop: 2023-10-07

## 2023-10-07 MED ADMIN — lidocaine (PF) (XYLOCAINE-MPF) 20 mg/mL (2 %) injection: INTRAVENOUS | @ 12:00:00 | Stop: 2023-10-07

## 2023-10-07 MED ADMIN — Propofol (DIPRIVAN) injection: INTRAVENOUS | @ 12:00:00 | Stop: 2023-10-07

## 2023-10-07 MED ADMIN — ceFAZolin (ANCEF) IVPB 2 g in 50 ml dextrose (premix): 2 g | INTRAVENOUS | @ 12:00:00 | Stop: 2023-10-07

## 2023-10-07 MED ADMIN — lidocaine 2% gel (XYLOCAINE) jelly urojet: TOPICAL | @ 12:00:00 | Stop: 2023-10-07

## 2023-10-07 MED ADMIN — povidone-iodine (PROFEND) 10 % swab: NASAL | @ 11:00:00 | Stop: 2023-10-07

## 2023-10-07 MED ADMIN — labetalol (NORMODYNE) injection: INTRAVENOUS | @ 12:00:00 | Stop: 2023-10-07

## 2023-10-07 MED ADMIN — ondansetron (ZOFRAN) injection: INTRAVENOUS | @ 12:00:00 | Stop: 2023-10-07

## 2023-10-07 MED ADMIN — dexmedeTOMIDine 400 mcg in sodium chloride 0.9 % 100 mL (4 mcg/mL) infusion: INTRAVENOUS | @ 12:00:00 | Stop: 2023-10-07

## 2023-10-07 NOTE — Unmapped (Signed)
 Date of Surgery: 10/07/2023    Patient Name: Diana Brown  DOB: Dec 12, 2000 (23 y.o. female)       [place]  SURGERY    Surgeons and Role:     * Elaine, Cyndee Ruth, MD - Primary    Cyndee LITTIE Elaine, MD, FACS     OPERATIVE NOTE: ([phone])       Procedure(s):  1.  EXCISION OF SKIN AND SUBCUTANEOUS TISSUE FOR HIDRADENITIS, LEFT BUTTOCK  2.  EXCISION OF SKIN AND SUBCUTANEOUS TISSUE FOR HIDRADENITIS, RIGHT BUTTOCK      Pre-op Diagnosis:     1. Hidradenitis suppurativa       Post-op Diagnosis: Same    Indications for Surgery:   1. Hidradenitis suppurativa         Findings: Multifocal hidradenitis, focused on buttocks at the patient's request today    Anesthesia: General    Estimated Blood Loss: 10 mL    Complications: None    Specimens:   ID Type Source Tests Collected by Time Destination   1 : hidradebitis tissue bilateral buttockes in formalin Tissue Buttock, left SURGICAL PATHOLOGY ERASMO Elaine Cyndee Ruth, MD 10/07/2023 [phone]    2 : hidradenitis tissue bilateral buttocks in formalin Tissue Buttock, right SURGICAL PATHOLOGY ERASMO Elaine Cyndee Ruth, MD 10/07/2023 [phone]          Implants:  * No implants in log *    PROCEDURE     The patient is brought to the operating room placed on the operative table in the right lateral decubitus position position where she was appropriately padded and underwent induction of anesthesia without complication.  Perioperative antibiotics were given.  SCDs were placed and operative throughout the procedure.  The marked area(s) was/were widely prepped and draped in the usual standard fashion.    A timeout was performed by the circulating nurse and all present were in agreement.    Under the patient supervision in the preoperative holding area we had marked the areas of planned unroofing/resection today.  First, I turned my attention to left perianal buttock skin.  This area was unroofed and any clustered nodules were resected.  Tunnels were followed and opened until the chronic areas of inflammation or thoroughly unroofed or removed.  This sweat gland/hidradenitis excision included skin and subcutaneous tissues and ultimately measured 4 cm x 5 cm.    Next I turned my attention to right perianal buttock skin.  In an identical fashion as above, this area was addressed, and removed and excised with the ultimate area of excision/unroofing 2 areas measuring 4 cm x 4 cm and inferior 3 cm x 4 cm.    Next hemostasis was tediously achieved with cautery.    Dressings were then applied.  Lidocaine  jelly followed by Xeroform petroleum gauze was placed on the open wounds.  Dry gauze was placed over this and Medipore porous medical tape was applied.    All counts were reported to me as correct.    Surgeon Notes: I performed the procedure    Cyndee LITTIE Elaine, MD, FACS.  Date: 10/07/2023  Time: 8:12 AM

## 2023-10-07 NOTE — Unmapped (Signed)
Post op Discharge     PAIN as a result of your surgery:  You may experience pain after surgery.  You may receive a prescription for pain medication from your surgeon.  Take this pain medication as prescribed.  It may not completely relieve your pain, but should keep your pain at a comfortable level.  If you experience severe pain unrelieved by your medication, notify your surgeon.  You can also use ice and if you have surgery on an arm or leg, elevate it to help with pain relief.     BLEEDING as a result of your surgery:  After surgery it is common to have some minor bleeding or oozing from the incision.  If the bleeding becomes worse, such as soaking one or more bandages over 2 to 4 hours or less time, notify your surgeon.      NAUSEA/VOMITING:  After you've had surgery, you may feel sick to your stomach (nauseated) or you may vomit.  Sometimes anesthesia can make you feel sick.  It's a common side effect and often doesn't last long.  Pain and pain medicine can sometimes cause nausea as well.  If you experience nausea or vomiting, drink plenty of fluids to prevent dehydration.  Choose water or other caffeine-free clear liquids until you feel better.  When you are able to eat, try clear soups, mild foods, and liquids until all symptoms are gone.    Call your doctor or seek immediate medical care if:   -You have new or worse nausea or vomiting  -You are too sick to your stomach to drink any fluids  -You cannot keep down fluids  -You have symptoms of dehydration, such as:   *Dry eyes and a dry mouth   *Passing only a little dark urine   *Feeling thirstier than usual  -You are dizzy or lightheaded after anesthesia has worn off, or you feel like you may faint    Signs of INFECTION  Notify your surgeon if you have signs of infection, such as:   -Increased pain, swelling, warmth or redness  -Red streaks leading from the incision  -Pus draining from the incision  -A fever    If you have any problems once you are discharged home, contact your surgeon's office.      After office hours call the main [facility] hospital number, [phone] and have the operator beep the surgeon or whomever is on call for the practice.      If you are still unable to reach your surgeon and need to be seen by a physician, report to [facility].  If it is an emergency dial 911.

## 2023-10-16 DIAGNOSIS — L732 Hidradenitis suppurativa: Principal | ICD-10-CM

## 2023-10-16 MED ORDER — HYDROCODONE 5 MG-ACETAMINOPHEN 325 MG TABLET
ORAL_TABLET | Freq: Four times a day (QID) | ORAL | 0 refills | 3.00000 days | Status: CP | PRN
Start: 2023-10-16 — End: 2023-10-21

## 2023-10-23 DIAGNOSIS — L732 Hidradenitis suppurativa: Principal | ICD-10-CM

## 2023-10-23 NOTE — Unmapped (Signed)
 Date of Service: 10/23/2023  Patient Name: Diana Brown  DOB: [birth year] (23 y.o. female)  Phone: [phone]    Primary Care Provider:  Jacques Camie Pepper, [facility]  [address] [place] [address]    Referring Provider:  Jacques Camie Pepper, [facility]  [address] [place],  [place] [address]   [place]  SURGERY    Cyndee LITTIE Shams, MD, [facility]  [place]  [facility] [place]  [address] [place] [address]  Phone: [phone]  Fax: [phone]     POST OP ASSESSMENT - PLAN       ICD-10-CM   1. Hidradenitis suppurativa  L73.2             Scheryl Sanborn presents for scheduled telephone postoperative follow-up,16 days since undergoing Excision Of Skin And Subcutaneous Tissue For Hidradenitis, Inguinal; With Simple Or Intermediate Repair - Bilateral and Excision Of Skin And Subcutaneous Tissue For Hidradenitis, Perianal, Perineal, Or Umbilical; With Complex Repair preformed on 10/07/2023.    History of Present Illness  Diana Brown is a 23 year old female who presents for follow-up after hidradenitis surgery. She is accompanied by her mother.    Approximately two weeks ago, she underwent surgery for hidradenitis. Since the procedure, there has been a gradual decrease in pain. She finds managing the vacuum dressing challenging, necessitating more frequent changes. She is concerned about when she can start sitting comfortably and uses a donut pillow to assist with this.    No fevers or other specific concerns post-surgery. She is managing large buttock and perianal wounds.      Management Plan / Recommendations :    Assessment & Plan  Postoperative wound care for hidradenitis surgery  Two weeks post hidradenitis surgery with large buttock and perianal wounds. Wounds are shallow, healthy pink, with no signs of infection. Healing is progressing rapidly and appropriately. Pain is present but easing. No fever or other concerning symptoms reported. Anticipated healing time is three or more months with wounds steadily decreasing in size. Sitting is based on comfort level, and there is no risk of harm to the wounds from sitting, only physical discomfort.  - Advise sitting based on comfort level, using a donut pillow if needed.  - Recommend offloading pressure from wounds and wearing comfortable clothing.  - Offer follow-up options: digital visit monthly or as-needed calls for concerns.  - Encourage sending pictures for evaluation if needed.    The patient can follow-up as needed at any point.    Physical Exam  RECTAL: Buttock and perianal wounds are shallow, healthy pink, with no signs of infection.    No results found for: FINALDX     We reviewed the medications.  We reviewed recent available imaging and referral provider documentation.  We discussed the impression and plan going forward.  I answered all of the questions.    No follow-ups on file.      This note was generated using speech recognition software and may contain word substitutions or errors.      REASON FOR VISIT     10/23/2023:  No chief complaint on file.        HISTORY     PMH: She has Presence of both artificial hip joints; Personal history of immunosupression therapy; Polyarticular juvenile idiopathic arthritis   ; High risk medication use; Nonintractable headache; Vitamin D  deficiency; and Hidradenitis suppurativa on their problem list.   PSH: She  has a past surgical history that includes Total hip  arthroplasty (Left, 05/30/2013); joint injections; Total hip arthroplasty (Right, 12/30/2013); Joint replacement; pr excision hidradenitis inguinal smpl/intrm rpr (Bilateral, 10/07/2023); and pr excision h/p/p/u complex repair (Midline, 10/07/2023).     FH: Her family history includes Anemia in her mother; Arthritis in her paternal grandmother; Diabetes in her maternal grandmother, maternal uncle, mother, paternal grandfather, and paternal grandmother; Lupus in her father and paternal grandmother.   SH: She  reports that she has never smoked. She has never used smokeless tobacco. She reports that she does not currently use alcohol. She reports that she does not use drugs.     Home Address  [address]  [place] [address]   Employer  Not Employed     Emergency Contacts  Emergency Contacts    None on File       Other Contacts       Name Relation Home Work [place] Mother  [phone]     Lynwood Goodell Father   [phone]             MEDICATIONS     After Visit Medications  Current Outpatient Medications   Medication Instructions    bimekizumab -bkzx (BIMZELX  AUTOINJECTOR) 320 mg/2 mL AtIn Inject the contents of 1 pen (320 mg) under the skin every twenty-eight (28) days.    bimekizumab -bkzx (BIMZELX  AUTOINJECTOR) 320 mg/2 mL AtIn Inject the contents of 1 pen (320 mg) under the skin on weeks 0, 2, 4, 6, 8, 10, 12, 14, and 16. Loading dose.    clindamycin  (CLEOCIN  T) 1 % lotion Apply 1-2 times daily to affected areas of HS lesions    clindamycin  (CLEOCIN  T) 1 % Swab 1 Application, Topical, 2 times a day (standard)    empty container (SHARPS-A-GATOR DISPOSAL SYSTEM) Misc Use as directed    mupirocin  (BACTROBAN ) 2 % ointment Apply topically up to three times to affected areas until healed    oxyCODONE (ROXICODONE) 5 mg, Oral, Every 4 hours PRN    triamcinolone  (KENALOG ) 0.1 % ointment Apply twice daily to affected HS areas for 7 days as needed for flares    New Prescriptions    No medications on file     Modified Medications    No medications on file     Discontinued Medications    No medications on file      Allergies  She is allergic to infliximab , sulfa (sulfonamide antibiotics), sulfasalazine , infliximab -dyyb, and golimumab.      RISK FACTORS - VITALS     Vitals  There were no vitals taken for this visit.      Blood pressure  BP Readings from Last 6 Encounters:   10/07/23 121/87   08/13/23 151/101   03/15/20 114/82   02/15/20 127/83   02/15/20 135/82   01/18/20 101/68         Glucose  No results found for: A1C, GLUCOSE Weight  There is no height or weight on file to calculate BMI.  Wt Readings from Last 6 Encounters:   10/07/23 92.2 kg (203 lb 4.2 oz)   08/13/23 93 kg (205 lb)   03/15/20 113.9 kg (251 lb 3.2 oz) (>99%, Z= 2.46)*   02/15/20 117.5 kg (259 lb 0.7 oz) (>99%, Z= 2.52)*   02/15/20 117.5 kg (259 lb 0.7 oz) (>99%, Z= 2.52)*   01/18/20 116.1 kg (256 lb) (>99%, Z= 2.49)*     * Growth percentiles are based on CDC (Girls, 2-20 Years) data.         LABS  Results      Lab Results   Component Value Date    WBC 7.8 11/21/2022    HGB 12.4 11/21/2022    MCV 85.8 11/21/2022    PLT 405 11/21/2022     No results found for: PT, PTT, INR, DDIMER   Lab Results   Component Value Date    NA 143 01/18/2020    K  01/18/2020      Comment:      Hemolyzed specimen.    CL 108 (H) 01/18/2020    CO2 26.5 (H) 01/18/2020    ANIONGAP 9 01/18/2020    BUN 6 (L) 11/21/2022    CREATININE 0.68 11/21/2022    EGFR >90 11/21/2022    GFRNAAF >90 01/18/2020    GFRAAF >90 01/18/2020    Lab Results   Component Value Date    PROT 7.2 01/18/2020    ALBUMIN 3.1 (L) 01/18/2020    BILITOT 0.2 01/18/2020    AST 16 11/21/2022    ALT 10 11/21/2022    ALKPHOS 69 01/18/2020       Lab Results   Component Value Date    CREATININE 0.68 11/21/2022    CREATININE 0.70 01/18/2020    CREATININE 0.68 11/23/2019    CREATININE 0.68 09/30/2019    CREATININE 0.59 08/10/2019    CREATININE 0.65 06/15/2019             Reviewed pertinent external/referral notes. Physical exam performed with pertinent findings noted in assessment / plan. Reviewed pertinent objective data / prior tests. Reviewed pertinent medications / prescription drug management.      I spent 5 minutes on the phone with the patient. I spent an additional 5 minutes on pre- and post-visit activities.  In person, A/V and audio only options available and the patient prefers audio only encounter.     The patient was physically located in [place]  or a state in which I am permitted to provide care. The patient understood that s/he may incur co-pays and cost sharing, and agreed to the telemedicine visit. The visit was completed via phone and/or video, which was appropriate and reasonable under the circumstances given the patient's presentation at the time.    The patient has been advised of the potential risks and limitations of this mode of treatment (including, but not limited to, the absence of in-person examination) and has agreed to be treated using telemedicine. The patient's/patient's family's questions regarding telemedicine have been answered.     If the phone/video visit was completed in an ambulatory setting, the patient has also been advised to contact their provider???s office for worsening conditions, and seek emergency medical treatment and/or call 911 if the patient deems either necessary.

## 2023-11-11 NOTE — Unmapped (Signed)
 Ligaya reports her Bimzelx  treatment is going well - she's had no new HS flares and has had improvements in inflammation/drainage in existing areas.     Final/week 16 load due on 10/15.    [facility] Clinical Assessment & Refill Coordination Note    Felicidad Sugarman, DOB: [birth year]  Phone: There are no phone numbers on file.    All above HIPAA information was verified with patient.     Was a Nurse, learning disability used for this call? No    Specialty Medication(s):   Inflammatory Disorders: Bimzelx      Current Medications[1]     Changes to medications: Adalin reports no changes at this time.    Medication list has been reviewed and updated in Epic: Yes    Allergies[2]    Changes to allergies: No    Allergies have been reviewed and updated in Epic: Yes    SPECIALTY MEDICATION ADHERENCE     Bimzelx  - 0 left    Medication Adherence    Patient reported X missed doses in the last month: 0  Specialty Medication: Bimzelx   Support network for adherence: family member          Specialty medication(s) dose(s) confirmed: Regimen is correct and unchanged.     Are there any concerns with adherence? No    Adherence counseling provided? Not needed    CLINICAL MANAGEMENT AND INTERVENTION      Clinical Benefit Assessment:    Do you feel the medicine is effective or helping your condition? Yes    Clinical Benefit counseling provided? Not needed    Adverse Effects Assessment:    Are you experiencing any side effects? No - patient reports some belly button irritation/infection that she's had previously that has flared back and is responsive to mupirocin . She has some on hand for treatment. Expect this is something unrelated since she's dealt with this in the past.     Are you experiencing difficulty administering your medicine? No    Quality of Life Assessment:    Quality of Life    Rheumatology  Oncology  Dermatology  1. What impact has your specialty medication had on the symptoms of your skin condition (i.e. itchiness, soreness, stinging)?: Some  2. What impact has your specialty medication had on your comfort level with your skin?: Some  Cystic Fibrosis          How many days over the past month did your HS  keep you from your normal activities? For example, brushing your teeth or getting up in the morning. Patient declined to answer    Have you discussed this with your provider? Not needed    Acute Infection Status:    Acute infections noted within Epic:  No active infections    Patient reported infection: None    Therapy Appropriateness:    Is therapy appropriate based on current medication list, adverse reactions, adherence, clinical benefit and progress toward achieving therapeutic goals? Yes, therapy is appropriate and should be continued     Clinical Intervention:    Was an intervention completed as part of this clinical assessment? No    DISEASE/MEDICATION-SPECIFIC INFORMATION      For patients on injectable medications: Patient currently has 0 doses left.  Next injection is scheduled for 8/20.    Chronic Inflammatory Diseases: Have you experienced any flares in the last month? No  Has this been reported to your provider? No    PATIENT SPECIFIC NEEDS     Does  the patient have any physical, cognitive, or cultural barriers? No    Is the patient high risk? No    Does the patient require physician intervention or other additional services (i.e., nutrition, smoking cessation, social work)? No    Does the patient have an additional or emergency contact listed in their chart? Yes    SOCIAL DETERMINANTS OF HEALTH     At the [facility], we have learned that life circumstances - like trouble affording food, housing, utilities, or transportation can affect the health of many of our patients.   That is why we wanted to ask: are you currently experiencing any life circumstances that are negatively impacting your health and/or quality of life? Patient declined to answer    Social Drivers of Health     Food Insecurity: Patient Declined (06/29/2023)    Received from [facility]    Hunger Vital Sign     Within the past 12 months, you worried that your food would run out before you got the money to buy more.: Patient declined     Within the past 12 months, the food you bought just didn't last and you didn't have money to get more.: Patient declined   Tobacco Use: Low Risk  (10/23/2023)    Patient History     Smoking Tobacco Use: Never     Smokeless Tobacco Use: Never     Passive Exposure: Not on file   Transportation Needs: No Transportation Needs (06/29/2023)    Received from [facility] - Transportation     Lack of Transportation (Medical): No     Lack of Transportation (Non-Medical): No   Alcohol Use: Not At Risk (06/29/2023)    Received from [facility]    AUDIT-C     Q1: How often do you have a drink containing alcohol?: Monthly or less     Q2: How many drinks containing alcohol do you have on a typical day when you are drinking?: 1 or 2     Q3: How often do you have six or more drinks on one occasion?: Never   Housing: Unknown (06/29/2023)    Received from [facility] Stability Vital Sign     In the last 12 months, was there a time when you were not able to pay the mortgage or rent on time?: Patient declined     In the past 12 months, how many times have you moved where you were living?: 0     At any time in the past 12 months, were you homeless or living in a shelter (including now)?: No   Physical Activity: Insufficiently Active (06/29/2023)    Received from [facility]    Exercise Vital Sign     On average, how many days per week do you engage in moderate to strenuous exercise (like a brisk walk)?: 2 days     On average, how many minutes do you engage in exercise at this level?: 30 min   Utilities: Not At Risk (06/29/2023)    Received from [facility] Utilities     Threatened with loss of utilities: No   Stress: No Stress Concern Present (06/29/2023)    Received from [facility] Occupational [facility] - Occupational Stress Questionnaire     Feeling of Stress : Only a little   Interpersonal Safety: Not on file   Substance Use: Not on file (02/03/2023)  Intimate Partner Violence: Not At Risk (06/29/2023)    Received from [facility]    HITS     Over the last 12 months how often did your partner physically hurt you?: Never     Over the last 12 months how often did your partner insult you or talk down to you?: Never     Over the last 12 months how often did your partner threaten you with physical harm?: Never     Over the last 12 months how often did your partner scream or curse at you?: Never   Social Connections: Somewhat Isolated (06/29/2023)    Received from [facility]    Social Network     How would you rate your social network (family, work, friends)?: Restricted participation with some degree of social isolation   Physicist, medical Strain: Low Risk  (06/29/2023)    Received from [employer]    Overall Financial Resource Strain (CARDIA)     Difficulty of Paying Living Expenses: Not very hard   Health Literacy: Not on file   Internet Connectivity: Not on file       Would you be willing to receive help with any of the needs that you have identified today? Not applicable       SHIPPING     Specialty Medication(s) to be Shipped:   Inflammatory Disorders: Bimzelx     Other medication(s) to be shipped: No additional medications requested for fill at this time    Specialty Medications not needed at this time: N/A     Changes to insurance: No    Cost and Payment: Patient has a copay of $4. They are aware and have authorized the pharmacy to charge the credit card on file.    Delivery Scheduled: Yes, Expected medication delivery date: 8/19.     Medication will be delivered via UPS to the confirmed prescription address in [facility].    The patient will receive a drug information handout for each medication shipped and additional FDA Medication Guides as required.  Verified that patient has previously received a Conservation officer, historic buildings and a Surveyor, mining.    The patient or caregiver noted above participated in the development of this care plan and knows that they can request review of or adjustments to the care plan at any time.      All of the patient's questions and concerns have been addressed.    Latrease Kunde [facility] Specialty Pharmacist         [1]   Current Outpatient Medications   Medication Sig Dispense Refill    bimekizumab -bkzx (BIMZELX  AUTOINJECTOR) 320 mg/2 mL AtIn Inject the contents of 1 pen (320 mg) under the skin every twenty-eight (28) days. 2 mL 6    bimekizumab -bkzx (BIMZELX  AUTOINJECTOR) 320 mg/2 mL AtIn Inject the contents of 1 pen (320 mg) under the skin on weeks 0, 2, 4, 6, 8, 10, 12, 14, and 16. Loading dose. 18 mL 0    clindamycin  (CLEOCIN  T) 1 % lotion Apply 1-2 times daily to affected areas of HS lesions 60 mL 11    clindamycin  (CLEOCIN  T) 1 % Swab Apply 1 Application topically two (2) times a day. 60 each 6    empty container (SHARPS-A-GATOR DISPOSAL SYSTEM) Misc Use as directed 1 each 2    mupirocin  (BACTROBAN ) 2 % ointment Apply topically up to three times to affected areas until healed 30 g 1    oxyCODONE (  ROXICODONE) 5 MG immediate release tablet Take 1 tablet (5 mg total) by mouth every four (4) hours as needed for pain for up to 25 doses. 10 tablet 0    triamcinolone  (KENALOG ) 0.1 % ointment Apply twice daily to affected HS areas for 7 days as needed for flares 80 g 0     No current facility-administered medications for this visit.   [2]   Allergies  Allergen Reactions    Infliximab  Hives and Itching     remicade     Sulfa (Sulfonamide Antibiotics) Rash    Sulfasalazine  Rash, Hives and Itching    Infliximab -Dyyb     Golimumab Hives and Itching     simoponi

## 2023-11-12 DIAGNOSIS — L732 Hidradenitis suppurativa: Principal | ICD-10-CM

## 2023-11-12 MED ORDER — KETOCONAZOLE 2 % TOPICAL CREAM
Freq: Two times a day (BID) | TOPICAL | 11 refills | 30.00000 days | Status: CP
Start: 2023-11-12 — End: 2024-11-11

## 2023-11-17 MED FILL — BIMZELX AUTOINJECTOR 320 MG/2 ML SUBCUTANEOUS AUTO-INJECTOR: SUBCUTANEOUS | 56 days supply | Qty: 8 | Fill #1

## 2023-11-20 DIAGNOSIS — L732 Hidradenitis suppurativa: Principal | ICD-10-CM

## 2023-11-20 MED ORDER — MUPIROCIN 2 % TOPICAL OINTMENT
1 refills | 0.00000 days | Status: CP
Start: 2023-11-20 — End: ?

## 2023-11-20 NOTE — Unmapped (Signed)
 LOV 09/11/23

## 2023-11-21 DIAGNOSIS — L732 Hidradenitis suppurativa: Principal | ICD-10-CM

## 2023-11-21 MED ORDER — DOXYCYCLINE HYCLATE 100 MG CAPSULE
ORAL_CAPSULE | Freq: Two times a day (BID) | ORAL | 2 refills | 30.00000 days | Status: CP
Start: 2023-11-21 — End: ?

## 2023-12-11 DIAGNOSIS — L732 Hidradenitis suppurativa: Principal | ICD-10-CM

## 2023-12-11 MED ORDER — MUPIROCIN 2 % TOPICAL OINTMENT
TOPICAL | 1 refills | 0.00000 days | Status: CP
Start: 2023-12-11 — End: ?

## 2023-12-31 NOTE — Unmapped (Signed)
 Diana Brown reports her Bimzelx  treatment has been somewhat helpful - she's having fewer and less severe flares, but still regular flaring of her HS. She's got follow up with clinic in a few weeks.    Her loading will be complete as of 10/15.     [facility] Clinical Assessment & Refill Coordination Note    Diana Brown, DOB: [birth year]  Phone: There are no phone numbers on file.    All above HIPAA information was verified with patient.     Was a Nurse, learning disability used for this call? No    Specialty Medication(s):   Inflammatory Disorders: Bimzelx      Current Medications[1]     Changes to medications: Saisha reports no changes at this time.    Medication list has been reviewed and updated in Epic: Yes    Allergies[2]    Changes to allergies: No    Allergies have been reviewed and updated in Epic: Yes    SPECIALTY MEDICATION ADHERENCE     Bimzelx  320 mg: 0 doses of medicine on hand     Medication Adherence    Patient reported X missed doses in the last month: 0  Specialty Medication: Bimzelx   Support network for adherence: family member          Specialty medication(s) dose(s) confirmed: Regimen is correct and unchanged.     Are there any concerns with adherence? No    Adherence counseling provided? Not needed    CLINICAL MANAGEMENT AND INTERVENTION      Clinical Benefit Assessment:    Do you feel the medicine is effective or helping your condition? Yes, somewhat    Clinical Benefit counseling provided? Not needed    Adverse Effects Assessment:    Are you experiencing any side effects? No    Are you experiencing difficulty administering your medicine? No    Quality of Life Assessment:    Quality of Life    Rheumatology  Oncology  Dermatology  1. What impact has your specialty medication had on the symptoms of your skin condition (i.e. itchiness, soreness, stinging)?: Some  2. What impact has your specialty medication had on your comfort level with your skin?: Some  Cystic Fibrosis          How many days over the past month did your HS  keep you from your normal activities? For example, brushing your teeth or getting up in the morning. Patient declined to answer    Have you discussed this with your provider? Not needed    Acute Infection Status:    Acute infections noted within Epic:  No active infections    Patient reported infection: None    Therapy Appropriateness:    Is the medication and dose appropriate considering the patient???s diagnosis, treatment, and disease journey, comorbidities, medical history, current medications, allergies, therapeutic goals, self-administration ability, and access barriers? Yes, therapy is appropriate and should be continued     Clinical Intervention:    Was an intervention completed as part of this clinical assessment? No    DISEASE/MEDICATION-SPECIFIC INFORMATION      For patients on injectable medications: Next injection is scheduled for 10/15 (LAST LOAD).    Chronic Inflammatory Diseases: Have you experienced any flares in the last month? Yes, still having some flaring  Has this been reported to your provider? No - patient reports improvement from baseline and has follow up in 2 weeks    PATIENT SPECIFIC NEEDS     Does the patient  have any physical, cognitive, or cultural barriers? No    Is the patient high risk? No    Does the patient require physician intervention or other additional services (i.e., nutrition, smoking cessation, social work)? No    Does the patient have an additional or emergency contact listed in their chart? Yes    SOCIAL DETERMINANTS OF HEALTH     At the [facility], we have learned that life circumstances - like trouble affording food, housing, utilities, or transportation can affect the health of many of our patients.   That is why we wanted to ask: are you currently experiencing any life circumstances that are negatively impacting your health and/or quality of life? Patient declined to answer    Social Drivers of Health     Food Insecurity: Patient Declined (06/29/2023)    Received from [facility]    Hunger Vital Sign     Within the past 12 months, you worried that your food would run out before you got the money to buy more.: Patient declined     Within the past 12 months, the food you bought just didn't last and you didn't have money to get more.: Patient declined   Tobacco Use: Low Risk  (10/23/2023)    Patient History     Smoking Tobacco Use: Never     Smokeless Tobacco Use: Never     Passive Exposure: Not on file   Transportation Needs: No Transportation Needs (06/29/2023)    Received from [facility] - Transportation     Lack of Transportation (Medical): No     Lack of Transportation (Non-Medical): No   Alcohol Use: Not At Risk (06/29/2023)    Received from [facility]    AUDIT-C     Q1: How often do you have a drink containing alcohol?: Monthly or less     Q2: How many drinks containing alcohol do you have on a typical day when you are drinking?: 1 or 2     Q3: How often do you have six or more drinks on one occasion?: Never   Housing: Unknown (06/29/2023)    Received from [facility] Stability Vital Sign     In the last 12 months, was there a time when you were not able to pay the mortgage or rent on time?: Patient declined     In the past 12 months, how many times have you moved where you were living?: 0     At any time in the past 12 months, were you homeless or living in a shelter (including now)?: No   Physical Activity: Insufficiently Active (06/29/2023)    Received from [facility]    Exercise Vital Sign     On average, how many days per week do you engage in moderate to strenuous exercise (like a brisk walk)?: 2 days     On average, how many minutes do you engage in exercise at this level?: 30 min   Utilities: Not At Risk (06/29/2023)    Received from [facility] Utilities     Threatened with loss of utilities: No   Stress: No Stress Concern Present (06/29/2023)    Received from [facility] - Occupational Stress Questionnaire     Feeling of Stress : Only a little   Interpersonal Safety: Not on file   Substance Use: Not on file (02/03/2023)   Intimate  Partner Violence: Not At Risk (06/29/2023)    Received from [facility]    HITS     Over the last 12 months how often did your partner physically hurt you?: Never     Over the last 12 months how often did your partner insult you or talk down to you?: Never     Over the last 12 months how often did your partner threaten you with physical harm?: Never     Over the last 12 months how often did your partner scream or curse at you?: Never   Social Connections: Somewhat Isolated (06/29/2023)    Received from [facility]    Social Network     How would you rate your social network (family, work, friends)?: Restricted participation with some degree of social isolation   Physicist, medical Strain: Low Risk  (06/29/2023)    Received from [employer]    Overall Financial Resource Strain (CARDIA)     Difficulty of Paying Living Expenses: Not very hard   Health Literacy: Not on file   Internet Connectivity: Not on file       Would you be willing to receive help with any of the needs that you have identified today? Not applicable       SHIPPING     Specialty Medication(s) to be Shipped:   Inflammatory Disorders: Bimzelx     Other medication(s) to be shipped: No additional medications requested for fill at this time    Specialty Medications not needed at this time: N/A     Changes to insurance: No    Cost and Payment: Patient has a copay of $4. They are aware and have authorized the pharmacy to charge the credit card on file.    Delivery Scheduled: Yes, Expected medication delivery date: 10/8.     Medication will be delivered via UPS to the confirmed prescription address in [facility].    The patient will receive a drug information handout for each medication shipped and additional FDA Medication Guides as required.  Verified that patient has previously received a Conservation officer, historic buildings and a Surveyor, mining.    The patient or caregiver noted above participated in the development of this care plan and knows that they can request review of or adjustments to the care plan at any time.      All of the patient's questions and concerns have been addressed.    Dela Sweeny [facility] Specialty Pharmacist         [1]   Current Outpatient Medications   Medication Sig Dispense Refill    bimekizumab -bkzx (BIMZELX  AUTOINJECTOR) 320 mg/2 mL AtIn Inject the contents of 1 pen (320 mg) under the skin every twenty-eight (28) days. 2 mL 6    bimekizumab -bkzx (BIMZELX  AUTOINJECTOR) 320 mg/2 mL AtIn Inject the contents of 1 pen (320 mg) under the skin on weeks 0, 2, 4, 6, 8, 10, 12, 14, and 16. Loading dose. 18 mL 0    clindamycin  (CLEOCIN  T) 1 % lotion Apply 1-2 times daily to affected areas of HS lesions 60 mL 11    clindamycin  (CLEOCIN  T) 1 % Swab Apply 1 Application topically two (2) times a day. 60 each 6    doxycycline  (VIBRAMYCIN ) 100 MG capsule Take 1 capsule (100 mg total) by mouth two (2) times a day. X 14 days as needed for flares 60 capsule 2    empty container (SHARPS-A-GATOR DISPOSAL SYSTEM) Misc Use as  directed 1 each 2    ketoconazole  (NIZORAL ) 2 % cream Apply 1 Application topically two (2) times a day. 30 g 11    mupirocin  (BACTROBAN ) 2 % ointment Apply 1 g topically up to three times to affected areas for 14 days. 30 g 1    oxyCODONE (ROXICODONE) 5 MG immediate release tablet Take 1 tablet (5 mg total) by mouth every four (4) hours as needed for pain for up to 25 doses. 10 tablet 0    triamcinolone  (KENALOG ) 0.1 % ointment Apply twice daily to affected HS areas for 7 days as needed for flares 80 g 0     No current facility-administered medications for this visit.   [2]   Allergies  Allergen Reactions    Infliximab  Hives and Itching     remicade     Sulfa (Sulfonamide Antibiotics) Rash    Sulfasalazine  Rash, Hives and Itching    Infliximab -Dyyb     Golimumab Hives and Itching     simoponi

## 2024-01-06 MED FILL — BIMZELX AUTOINJECTOR 320 MG/2 ML SUBCUTANEOUS AUTO-INJECTOR: SUBCUTANEOUS | 28 days supply | Qty: 2 | Fill #2

## 2024-01-14 NOTE — Unmapped (Signed)
 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 Elaine  - Following with Rheum (Dr. Dollene)  - Continue arava  with rheumatology   - Continue clindamycin  (CLEOCIN  T) 1 % lotion; Apply 1-2 times daily to affected areas of HS lesions  - Continue bimekizumab -bkzx (BIMZELX  AUTOINJECTOR) 320 mg/mL AtIn; Inject the contents of 1 pen (320 mg) once every 4 weeks. Maintenance dose.   - Continue mupirocin  (BACTROBAN ) 2 % ointment; Apply topically up to three times to affected areas until healed  - Continue triamcinolone  0.1% ointment; apply twice daily to HS lesions for 5-7 days (could increase to clobetasol)  - Patient may consider future excisions of axillae with Dr. Elaine     High Risk Medication Use (Humira )  - CBC, CMP, ESR, CRP 03/2023 - WNL  - Quantiferon Gold and Hep B/C panel obtained on 11/21/2022 - Negative; re-order quant gold today     The patient was advised to call for an appointment should any new, changing, or symptomatic lesions develop.     RTC: Return in about 4 months (around 05/17/2024). or sooner as needed   _________________________________________________________________      Chief Complaint     Chief Complaint   Patient presents with    Chronic Condition Follow-Up     HSS Follow up/ drainage and pain level is a 4       HPI     Diana Brown is a 23 y.o. female who presents as a returning patient (last seen 09/11/2023) to Dermatology for follow up of HS.     History of Present Illness  Diana Brown is a 23 year old female with hidradenitis suppurativa who presents for follow-up on her surgical and medical management.    She has a history of hidradenitis suppurativa and underwent surgery for excision of lesions on the buttocks. The healing process was challenging due to the location, requiring frequent bandage changes and resulting in urine exposure to the wound, causing discomfort.    She is currently receiving injections every two weeks, with plans to transition to monthly dosing. The injections have been effective, particularly in managing joint pain, which she describes as 'fine.' She is no longer on antibiotics and uses topical clindamycin  as needed. Triamcinolone  is applied for persistent lesions, twice daily for four days in a row.    She mentions ongoing drainage from a lesion under her breast and on her stomach, as well as a stubborn wound on her inner thigh that remains open. Clindamycin  and occasionally triamcinolone  are applied to these areas. The patient reports some new lesions in the abdomen area.    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        The patient denies any other new or changing lesions or areas of concern.     Pertinent Past Medical History         Problem List       High risk medication use - Primary    Relevant Orders    Quantiferon TB Gold Plus    Hidradenitis suppurativa         Past Medical History, Family History, Social  History, Medication List, Allergies, and Problem List were reviewed in the rooming section of Epic.     ROS: Other than symptoms mentioned in the HPI, no fevers, chills, or other skin complaints    Physical Examination     GENERAL: Well-appearing female in no acute distress, resting comfortably.  NEURO: Alert and oriented, answers questions appropriately  PSYCH: Normal mood and affect  SKIN: Examination of the axillae, abdomen, groin, genitalia, buttocks, medial thighs was performed  - nondraining sinuses of the axillae, groin, inframammary  - inflamed nodule of mons pubis, medial thighs  - buttocks with appropriately healing excision sites      All areas not commented on are within normal limits or unremarkable.           (Approved Template 12/13/2019)

## 2024-01-15 DIAGNOSIS — L732 Hidradenitis suppurativa: Principal | ICD-10-CM

## 2024-01-15 DIAGNOSIS — Z79899 Other long term (current) drug therapy: Principal | ICD-10-CM

## 2024-01-15 NOTE — Unmapped (Addendum)
 Apply Triamcinolone  daily for ten day       Meet your team:     Your nurse is: Brad    Please remember to fill out the survey you will receive after your visit. Your comments help us  continue to improve our care.      Thanks in advance!      [facility] Dermatology Clinical Staff  You are welcome to join the [facility] for HS of the [employer] Support group online at [email or web address] or in-person at our regular meetings.  You can textHS to [phone] for meeting reminders or join the group online for regular updates.  This can be a great opportunity to interact and learn from other patients and help work with the HS community.  We hope to see your there!    Hidradentis Suppurativa (pronounced ???high-drad-en-eye-tis/sup-your-uh-tee-vah???) is a chronic disease of hair follicles.  The lesions occur most commonly on areas of skin-to-skin contact: under the arms (axillary area), in the groin, around the buttocks, in the region around the anus and genitals, and on the skin between and under the breasts. In women, the underarms, groin, and breast areas are most commonly affected. Men most often have HS lesions on the buttocks and under the arms and may also have HS at the back of the neck and behind and around the ears.    What does HS look and feel like?   The first thing that someone with HS notices is a tender, raised, red bump that looks like an under-the-skin pimple or boil. Sometimes HS lesions have two or more ???heads.???  In mild disease only an occasional boil or abscess may occur, but in more active disease there can be many new lesions every month.  Some abscesses can become larger and may open and drain pus.  Bleeding and increased odor can also occur. In severe disease, deeper abscesses develop and may connect with each other under the skin to form tunnel-like tracts (sinuses, fistulas).  These may drain constantly, or may temporarily improve and then usually begin draining again over time.  In people who have had sinus tracts for some time, scars form that feel like ropes under the skin. In the very worst cases, networks of sinus tracts can form deeper in the body, including the muscle and other tissues. Many people with severe HS have scars that can limit their ability to freely move their arms or legs, though this is very unlikely for most patients.     Clinicians usually classify or ???grade??? HS using the [facility] staging system according to the severity of the disease for each body location:   [place] stage I: one or more abscesses are present, but no sinus tracts have formed and no scars have developed   [place] stage II: one or more abscesses are present that resolve and recur; on sinus tract can be present and scarring is seen   Tressia stage III: many abscesses and more than one sinus tract is present with extensive scars.    What causes HS?  The cause of HS is not completely understood.  It seems to be a disorder of hair follicles and often many family members are affected so genetics probably play a strong role.  Bacteria are often present and may make the disease worse, but infection does not seem to be the main cause. Hormones are also likely play a role since the condition typically starts around puberty when hair follicles under the arms and in the groin start to  change.  It can sometimes flare with menstrual cycles in women as well.  In most cases it lasts for decades and starts to improve to some extent in the late 30s and 40s as long as many fistulas have not already formed.  Women are three times more likely than men to develop HS.    Other factors are known to contribute to HS flaring or becoming worse, though they are likely not the main causes. The factors most commonly associated with HS include:   Cigarette smoking - Stopping smoking will likely not cure the disease, but likely is helpful in reducing how much and how often it flares and may prevent it from getting as bad over time.   Higher weight - HS may occur even in people that are not overweight, but it is much more common in patients that are.  There is some evidence that losing weight and eating a diet low in sugars and fats may be helpful in improving hidradenitis, though this is not helpful for everyone.  Working with a nutritionist may be an important way to help with this and is something your physician can help coordinate    Hidradenitis is not contagious.  It is not caused by a problem with personal hygiene or any other activity or behavior of those with the disease.    How can your doctor help you treat your hidradenitis?  Clinicians use both medication and surgery to treat HS. The choice of treatment--or combination of treatments--is made according to an individual patient???s needs. Clinicians consider several factors in determining the most appropriate plan for therapy:   Severity of disease - medications and some laser treatments are usually able to control disease best when fistulas are not present.  Fistulas typically require surgery.   Extent and location of disease   Chronicity (how often the lesions recur)    A number of different surgical methods have been developed that are useful for certain patients under particular circumstances. These can be done with local numbing and healing at home for some areas when disease is not too extensive with relatively brief recovery times.  In more extensive disease there may be a need for larger excisions under general anesthesia with healing time in the hospital and prolonged recovery periods for better disease control.      In addition, many medical treatments have been tried--some with more success than others. No medication is effective for all patients, and you and your doctor may have to try several different treatments or combinations of treatments before you find the treatment plan that works best for you.  The goals of therapy with medications that are either topical (used on the skin) or systemic (taken by mouth) are:  1. to clear the lesions or at least reduce their number and extent, and  2. to prevent new lesions from forming.  3. To reduce pain, drainage, and odor  Some of the types of medications commonly used are antibacterial skin washes and the topical antibiotics to prevent secondary infections and corticosteroid injections into the lesions to reduce inflammation.     Other medications that may be used include retinoids (similar to Accutane), drugs that effect how hormones and hair follicles interact, drugs that affect your immune system (such as methotrexate, adalimumab /Humira , and Remicaid/infliximab ), steroids, and oral antibiotics.    Lasers that destroy hair follicles can also be helpful since they reduce the hair follicles that cause the problems.  Multiple treatments are typically required over time and  there is some discomfort associated with treatment, but it is typically very fast and well-tolerated.    It is very important to realize that hidradenitis cannot usually be completely cured with any single medication or surgical procedure.  It is a disease that can be very stubborn and difficult to control, but with good treatment a lot of improvement and sometimes temporary remissions can be obtained. Poorly controlled disease can cause more fistulas to form and make managing the disease much more difficult over time so it is important to seek care to reduce major flares.  Surgery can provide a long term cure in some areas, though the disease can start again or continue in nearby areas.  A dermatologist is often the best person to help coordinate disease treatment, and sometimes other surgeons, pain specialists, other specialists, and nutritionists may be part of the treatment team.    For severe disease, the first goal is often to reduce pain and symptoms with medicines so that the disease feels more stable. Once it's stable, we often start thinking about how to address areas that have completely gotten better with surgery if they are still causing problems.    What can you do to help your HS?  1. Stopping smoking is hard and may not fix everything, but it may be a step in the right direction.  We or your primary care physician can provide resources to help stop if you are interested.  2. Follow a healthy diet and try to achieve a healthy weight.  Some other self-help measures are:   Keep your skin cool and dry (becoming overheated and sweating can contribute to an HS flare)   To reduce the pain of cysts or nodules or to help them to drain, apply hot compresses or soak in hot water for 10 minutes at a time (use a clean washcloth or a teabag soaked in hot water)   For female patients, cotton underwear that does not have tight elastic in the groin can be helpful.  Boyshort, brief, or boxer style underwear may be a better option as friction on hair follicles in affected areas can be a major trigger in some patients.  These can be easily found on [place] or with some retailers.  Fruit of the Loom and Underworks are two brands that are sometimes recommended.    Finally, know that you are not alone. Coping with the pain and other symptoms of HS can be very difficult, so it may be helpful to connect with others who live with HS. Patient groups and networks can be sources of important information and support. Some internet resources for information and connections are provided below.    Psychologytoday.com is a resource to find psychologists and therapists that can help support you in your are     [email or web address] can help connect with sexual health resources and counselors    Resources for Information    The Hidradenitis Suppurativa Foundation: A nonprofit organized by a group of physicians interested in treating and advancing research in hidradenitis suppurativa.  This group advocates for better care and research for hidradenitis and has educational materials put together specifically for patients that have been reviewed and produced by doctors and people with hidradenitis.    American Academy of Dermatology  ARanked.fi    [employer]  ElevatorPitchers.de.html  NORD: IT trainer for [employer]  https://www.rarediseases.org/rare-disease-information/rare-diseases/byID/358/viewAbstract  Trials of new medications for HS  Https://www.clinicaltrials.gov

## 2024-01-19 LAB — QUANTIFERON TB GOLD PLUS
QUANTIFERON MITOGEN VALUE: >10 [IU]/mL
QUANTIFERON NIL VALUE: 0.09 [IU]/mL
QUANTIFERON TB GOLD: NEGATIVE
QUANTIFERON TB1 AG VALUE: 0.1 [IU]/mL
QUANTIFERON TB2 AG VALUE: 0.1 [IU]/mL

## 2024-01-27 NOTE — Progress Notes (Addendum)
 This pharmacist was notified by a technician that this patient has reported they are experiencing the following side effects yeast infection.. I have reviewed the patient's medical record and have determined that no further pharmacist action is needed. - provider is aware per mychart messages and has offered treatment if desired.    Approximate time spent: 0-5 minutes    Gerard DELENA Sharps, Clinical Specialty Pharmacist  [facility] Specialty and Home Delivery Pharmacy        [facility] Specialty and Home Delivery Pharmacy Refill Coordination Note    Diana Brown, [place]: [birth year]  Phone: There are no phone numbers on file.      All above HIPAA information was verified with patient.         01/27/2024    12:42 PM   Specialty Rx Medication Refill Questionnaire   Which Medications would you like refilled and shipped? Bimzelx  Autoinjector 320mg /44m   Please list all current allergies: Sulfa and other infusion medication.   Have you missed any doses in the last 30 days? No   Have you had any changes to your medication(s) since your last refill? No   How much of each medication do you have remaining at home? (eg. number of tablets, injections, etc.) 0   If receiving an injectable medication, next injection date is 02/11/2024   Have you experienced any side effects in the last 30 days? Yes   Please list the medication side effects below. (A pharmacist will reach out to you shortly to discuss your side effects. Note: your medications will not be shipped until we reach out to you). Vaginal yeast infection   Please enter the full address (street address, city, state, zip code) where you would like your medication(s) to be delivered to. [address] [place], [place] , [address]   Please specify on which day you would like your medication(s) to arrive. Note: if you need your medication(s) within 3 days, please call the pharmacy to schedule your order at [phone]  02/06/2024   Has your insurance changed since your last refill? No   Would you like a pharmacist to call you to discuss your medication(s)? No   Do you require a signature for your package? (Note: if we are billing Medicare Part B or your order contains a controlled substance, we will require a signature) No   I have been provided my out of pocket cost for my medication and approve the pharmacy to charge the amount to my credit card on file. Yes         Completed refill call assessment today to schedule patient's medication shipment from the [facility] and Home Delivery Pharmacy [phone]).  All relevant notes have been reviewed.       Confirmed patient received a Conservation Officer, Historic Buildings and a Surveyor, Mining with first shipment. The patient will receive a drug information handout for each medication shipped and additional FDA Medication Guides as required.         REFERRAL TO PHARMACIST     Referral to the pharmacist: Yes - new side effects are reported: yeast infection. Refills were scheduled and concern routed (high priority) to pharmacist for evaluation.      SHIPPING     Shipping address confirmed in Epic.     Delivery Scheduled: Yes, Expected medication delivery date: 11/7.     Medication will be delivered via UPS to the prescription address in Epic WAM.    Eleanor BROCKS [facility] Specialty and Home  Delivery Pharmacy Specialty Technician

## 2024-02-05 MED FILL — BIMZELX AUTOINJECTOR 320 MG/2 ML SUBCUTANEOUS AUTO-INJECTOR: SUBCUTANEOUS | 28 days supply | Qty: 2 | Fill #0

## 2024-02-24 NOTE — Progress Notes (Signed)
 [facility] Specialty and Home Delivery Pharmacy Refill Coordination Note    Diana Brown, DOB: [birth year]  Phone: There are no phone numbers on file.      All above HIPAA information was verified with patient.         02/24/2024     3:44 PM   Specialty Rx Medication Refill Questionnaire   Which Medications would you like refilled and shipped? Bimzelx    Please list all current allergies: Sulfa   Have you missed any doses in the last 30 days? No   Have you had any changes to your medication(s) since your last refill? No   How much of each medication do you have remaining at home? (eg. number of tablets, injections, etc.) 0   If receiving an injectable medication, next injection date is 03/12/2024   Have you experienced any side effects in the last 30 days? No   Please enter the full address (street address, city, state, zip code) where you would like your medication(s) to be delivered to. [address] [place], [place] , [address].   Please specify on which day you would like your medication(s) to arrive. Note: if you need your medication(s) within 3 days, please call the pharmacy to schedule your order at [phone]  03/09/2024   Has your insurance changed since your last refill? No   Would you like a pharmacist to call you to discuss your medication(s)? No   Do you require a signature for your package? (Note: if we are billing Medicare Part B or your order contains a controlled substance, we will require a signature) No   I have been provided my out of pocket cost for my medication and approve the pharmacy to charge the amount to my credit card on file. Yes         Completed refill call assessment today to schedule patient's medication shipment from the [facility] and Home Delivery Pharmacy [phone]).  All relevant notes have been reviewed.       Confirmed patient received a Conservation Officer, Historic Buildings and a Surveyor, Mining with first shipment. The patient will receive a drug information handout for each medication shipped and additional FDA Medication Guides as required.         REFERRAL TO PHARMACIST     Referral to the pharmacist: Not needed      [facility]     Shipping address confirmed in Epic.     Delivery Scheduled: Yes, Expected medication delivery date: 03/09/2024.     Medication will be delivered via UPS to the prescription address in Epic WAM.    Lamarr CHRISTELLA Dross   [facility] Specialty and Home Delivery Pharmacy Specialty Technician

## 2024-03-08 MED FILL — BIMZELX AUTOINJECTOR 320 MG/2 ML SUBCUTANEOUS AUTO-INJECTOR: SUBCUTANEOUS | 28 days supply | Qty: 2 | Fill #1

## 2024-04-02 NOTE — Progress Notes (Signed)
 [facility] Specialty and Home Delivery Pharmacy Refill Coordination Note    Diana Brown, DOB: [birth year]  Phone: There are no phone numbers on file.      All above HIPAA information was verified with patient.         03/31/2024     5:06 PM   Specialty Rx Medication Refill Questionnaire   Which Medications would you like refilled and shipped? Bimzelx    Please list all current allergies: Sulfa   Have you missed any doses in the last 30 days? No   Have you had any changes to your medication(s) since your last refill? No   How much of each medication do you have remaining at home? (eg. number of tablets, injections, etc.) 0   If receiving an injectable medication, next injection date is 04/12/2024   Have you experienced any side effects in the last 30 days? No   Please enter the full address (street address, city, state, zip code) where you would like your medication(s) to be delivered to. [address] [place], [place], [address]   Please specify on which day you would like your medication(s) to arrive. Note: if you need your medication(s) within 3 days, please call the pharmacy to schedule your order at [phone]  04/08/2024   Has your insurance changed since your last refill? No   Would you like a pharmacist to call you to discuss your medication(s)? No   Do you require a signature for your package? (Note: if we are billing Medicare Part B or your order contains a controlled substance, we will require a signature) No   I have been provided my out of pocket cost for my medication and approve the pharmacy to charge the amount to my credit card on file. Yes         Completed refill call assessment today to schedule patient's medication shipment from the [facility] and Home Delivery Pharmacy [phone]).  All relevant notes have been reviewed.       Confirmed patient received a Conservation Officer, Historic Buildings and a Surveyor, Mining with first shipment. The patient will receive a drug information handout for each medication shipped and additional FDA Medication Guides as required.         REFERRAL TO PHARMACIST     Referral to the pharmacist: Not needed      [facility]     Shipping address confirmed in Epic.     Delivery Scheduled: Yes, Expected medication delivery date: 04/08/24.     Medication will be delivered via Next Day Courier to the prescription address in Epic WAM.    Dena LOISE Dolores   [facility] Specialty and Home Delivery Pharmacy Specialty Technician

## 2024-04-07 MED FILL — BIMZELX AUTOINJECTOR 320 MG/2 ML SUBCUTANEOUS AUTO-INJECTOR: SUBCUTANEOUS | 28 days supply | Qty: 2 | Fill #2

## 2024-04-30 NOTE — Progress Notes (Signed)
 [facility] Refill Coordination Note    Diana Brown, DOB: 03/25/2001  Phone: There are no phone numbers on file.      All above HIPAA information was verified with patient.         04/29/2024     4:13 PM   Specialty Rx Medication Refill Questionnaire   Which Medications would you like refilled and shipped? Bimzelx    Please list all current allergies: Sulfa   Have you missed any doses in the last 30 days? No   Have you had any changes to your medication(s) since your last refill? No   How much of each medication do you have remaining at home? (eg. number of tablets, injections, etc.) 0   Have you experienced any side effects in the last 30 days? No   Please enter the full address (street address, city, state, zip code) where you would like your medication(s) to be delivered to. [address], [place], [place], [address]   Please specify on which day you would like your medication(s) to arrive. Note: if you need your medication(s) within 3 days, please call the pharmacy to schedule your order at [phone]  05/11/2024   Has your insurance changed since your last refill? No   Would you like a pharmacist to call you to discuss your medication(s)? No   Do you require a signature for your package? (Note: if we are billing Medicare Part B or your order contains a controlled substance, we will require a signature) No   I have been provided my out of pocket cost for my medication and approve the pharmacy to charge the amount to my credit card on file. Yes         Completed refill call assessment today to schedule patient's medication shipment from the [facility] [phone]).  All relevant notes have been reviewed.       Confirmed patient received a Conservation Officer, Historic Buildings and a Surveyor, Mining with first shipment. The patient will receive a drug information handout for each medication shipped and additional FDA Medication Guides as required.         REFERRAL TO PHARMACIST     Referral to the pharmacist: Not needed      [facility]     Shipping address confirmed in Epic.     Delivery Scheduled: Yes, Expected medication delivery date: 05/11/24.     Medication will be delivered via UPS to the prescription address in Epic WAM.    Dena LOISE Dolores   [facility] Specialty Technician
